# Patient Record
Sex: Female | Born: 1985 | Race: Black or African American | Hispanic: No | Marital: Single | Smoking: Never smoker
Health system: Southern US, Community
[De-identification: ages and names within clinical notes are randomized; demographics above are authoritative.]

## PROBLEM LIST (undated history)

## (undated) DIAGNOSIS — Z87440 Personal history of urinary (tract) infections: Secondary | ICD-10-CM

## (undated) HISTORY — PX: WISDOM TOOTH EXTRACTION: SHX21

## (undated) HISTORY — DX: Personal history of urinary (tract) infections: Z87.440

---
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
## 2009-06-28 ENCOUNTER — Ambulatory Visit: Payer: Self-pay | Admitting: Obstetrics and Gynecology

## 2009-11-29 ENCOUNTER — Inpatient Hospital Stay: Payer: Self-pay | Admitting: Obstetrics and Gynecology

## 2009-12-05 LAB — PATHOLOGY REPORT

## 2010-09-18 ENCOUNTER — Encounter: Payer: Self-pay | Admitting: Maternal & Fetal Medicine

## 2011-01-18 ENCOUNTER — Inpatient Hospital Stay: Payer: Self-pay

## 2011-01-21 LAB — PATHOLOGY REPORT

## 2012-09-29 DIAGNOSIS — A6000 Herpesviral infection of urogenital system, unspecified: Secondary | ICD-10-CM | POA: Insufficient documentation

## 2013-08-05 ENCOUNTER — Emergency Department: Payer: Self-pay | Admitting: Emergency Medicine

## 2014-10-30 LAB — HM HIV SCREENING LAB: HM HIV Screening: NEGATIVE

## 2015-05-11 ENCOUNTER — Emergency Department
Admission: EM | Admit: 2015-05-11 | Discharge: 2015-05-11 | Disposition: A | Discharge status: Home / Self Care | Payer: BLUE CROSS/BLUE SHIELD | Attending: Emergency Medicine | Admitting: Emergency Medicine

## 2015-05-11 ENCOUNTER — Encounter: Payer: Self-pay | Admitting: Emergency Medicine

## 2015-05-11 DIAGNOSIS — J029 Acute pharyngitis, unspecified: Secondary | ICD-10-CM | POA: Diagnosis present

## 2015-05-11 DIAGNOSIS — J0110 Acute frontal sinusitis, unspecified: Secondary | ICD-10-CM | POA: Diagnosis not present

## 2015-05-11 DIAGNOSIS — J321 Chronic frontal sinusitis: Secondary | ICD-10-CM

## 2015-05-11 LAB — POCT RAPID STREP A: Streptococcus, Group A Screen (Direct): NEGATIVE

## 2015-05-11 MED ORDER — AMOXICILLIN 500 MG PO TABS: 500.0000 mg | ORAL_TABLET | Freq: Three times a day (TID) | ORAL | Status: DC

## 2015-05-11 MED ORDER — AMOXICILLIN 500 MG PO CAPS
500.0000 mg | ORAL_CAPSULE | Freq: Once | ORAL | Status: AC
  Administered 2015-05-11: 500 mg via ORAL
  Filled 2015-05-11: qty 1

## 2015-05-11 NOTE — ED Provider Notes (Signed)
[facility] Emergency Department Provider Note  ____________________________________________  Time seen: Approximately 9:57 PM  I have reviewed the triage vital signs and the nursing notes.   HISTORY  Chief Complaint Sore Throat    HPI Paige Chandler is a 30 y.o. female with over one week history of URI symptoms. Was seen last week and treated for viral URI. Her symptoms have persisted with sinus pressure, fullness and now worsening sore throat and fever. She also has cough with congestion. No shortness of breath. Just some mild chest discomfort. Also some dyspepsia.   History reviewed. No pertinent past medical history.  There are no active problems to display for this patient.   History reviewed. No pertinent past surgical history.  Current Outpatient Rx  Name  Route  Sig  Dispense  Refill  . amoxicillin (AMOXIL) 500 MG tablet   Oral   Take 1 tablet (500 mg total) by mouth 3 (three) times daily.   30 tablet   0     Allergies Tramadol  History reviewed. No pertinent family history.  Social History Social History  Substance Use Topics  . Smoking status: Never Smoker   . Smokeless tobacco: None  . Alcohol Use: Yes     Comment: holidays    Review of Systems Constitutional: No fever/chills Eyes: No visual changes. ENT:  sore throat. Cardiovascular: chest pain with cough Respiratory: Denies shortness of breath. Gastrointestinal: No abdominal pain.  No nausea, no vomiting.  No diarrhea.  No constipation. Genitourinary: Negative for dysuria. Musculoskeletal: Negative for back pain. Skin: Negative for rash. Neurological: Negative for headaches, focal weakness or numbness. 10-point ROS otherwise negative.  ____________________________________________   PHYSICAL EXAM:  VITAL SIGNS: ED Triage Vitals  Enc Vitals Group     BP 05/11/15 1911 129/80 mmHg     Pulse Rate 05/11/15 1911 80     Resp 05/11/15 1911 18     Temp 05/11/15  1911 98.8 F (37.1 C)     Temp Source 05/11/15 1911 Oral     SpO2 05/11/15 1911 100 %     Weight 05/11/15 1911 150 lb (68.04 kg)     Height 05/11/15 1911  (1.626 m)     Head Cir --      Peak Flow --      Pain Score 05/11/15 1912 8     Pain Loc --      Pain Edu? --      Excl. in GC? --     Constitutional: Alert and oriented. Well appearing and in no acute distress. Eyes: Conjunctivae are normal. PERRL. EOMI. Ears:  Clear with normal landmarks. No erythema. Head: Atraumatic. Nose: No congestion/rhinnorhea. Mouth/Throat: Mucous membranes are moist.  Oropharynx non-erythematous. No lesions. Neck:  Supple.  No adenopathy.   Cardiovascular: Normal rate, regular rhythm. Grossly normal heart sounds.  Good peripheral circulation. Respiratory: Normal respiratory effort.  No retractions. Lungs CTAB. Gastrointestinal: Soft and nontender. No distention. No abdominal bruits. No CVA tenderness. Musculoskeletal: Nml ROM of upper and lower extremity joints. Neurologic:  Normal speech and language. No gross focal neurologic deficits are appreciated. No gait instability. Skin:  Skin is warm, dry and intact. No rash noted. Psychiatric: Mood and affect are normal. Speech and behavior are normal.  ____________________________________________   LABS (all labs ordered are listed, but only abnormal results are displayed)  Labs Reviewed  POCT RAPID STREP A   ____________________________________________  EKG   ____________________________________________  RADIOLOGY   ____________________________________________   PROCEDURES  Procedure(s) performed: None  Critical Care performed: No  ____________________________________________   INITIAL IMPRESSION / ASSESSMENT AND PLAN / ED COURSE  Pertinent labs & imaging results that were available during my care of the patient were reviewed by me and considered in my medical decision making (see chart for details).  30 year old female  with over one week history of URI symptoms, getting worse with sinus pressure and sore throat. Concern for sinusitis. Treated with amoxicillin. Will follow-up if not improving. ____________________________________________   FINAL CLINICAL IMPRESSION(S) / ED DIAGNOSES  Final diagnoses:  Frontal sinusitis, unspecified chronicity      Ignacia Bayley, PA-C 05/11/15 2200  Phineas Semen, MD 05/11/15 [phone]

## 2015-05-11 NOTE — ED Notes (Addendum)
Pt c/o sore throat since last night; unsure if any fever, has not checked; pain with swallowing; has taken tylenol, tried warm salt water gargles; no relief; able to swallow

## 2015-05-11 NOTE — Discharge Instructions (Signed)

## 2016-01-07 ENCOUNTER — Encounter: Payer: Self-pay | Admitting: Emergency Medicine

## 2016-01-07 ENCOUNTER — Emergency Department
Admission: EM | Admit: 2016-01-07 | Discharge: 2016-01-07 | Disposition: A | Discharge status: Home / Self Care | Payer: BLUE CROSS/BLUE SHIELD | Attending: Emergency Medicine | Admitting: Emergency Medicine

## 2016-01-07 DIAGNOSIS — K047 Periapical abscess without sinus: Secondary | ICD-10-CM | POA: Diagnosis not present

## 2016-01-07 DIAGNOSIS — K0889 Other specified disorders of teeth and supporting structures: Secondary | ICD-10-CM | POA: Diagnosis present

## 2016-01-07 MED ORDER — NAPROXEN 500 MG PO TABS: 500.0000 mg | ORAL_TABLET | Freq: Two times a day (BID) | ORAL | 0 refills | Status: DC

## 2016-01-07 MED ORDER — AMOXICILLIN 875 MG PO TABS: 875.0000 mg | ORAL_TABLET | Freq: Two times a day (BID) | ORAL | 0 refills | Status: DC

## 2016-01-07 NOTE — ED Triage Notes (Signed)
Pt with swelling to left lower jaw, reports dental pain.

## 2016-01-07 NOTE — ED Provider Notes (Signed)
[facility] Emergency Department Provider Note   ____________________________________________   None    (approximate)  I have reviewed the triage vital signs and the nursing notes.   HISTORY  Chief Complaint Dental Pain    HPI MAUREEN DELATTE is a 30 y.o. female presents with a one-day history of left lower jaw swelling and dental pain. Patient states started yesterday without worse today. Denies any fever chills nausea vomiting or difficulty swallowing. Describes her level of pain or discomfort is an 8/10 at this time.   History reviewed. No pertinent past medical history.  There are no active problems to display for this patient.   No past surgical history on file.  Prior to Admission medications   Medication Sig Start Date End Date Taking? Authorizing Provider  amoxicillin (AMOXIL) 875 MG tablet Take 1 tablet (875 mg total) by mouth 2 (two) times daily. 01/07/16   Evangeline Dakin, PA-C  naproxen (NAPROSYN) 500 MG tablet Take 1 tablet (500 mg total) by mouth 2 (two) times daily with a meal. 01/07/16   Evangeline Dakin, PA-C    Allergies Tramadol  No family history on file.  Social History Social History  Substance Use Topics  . Smoking status: Never Smoker  . Smokeless tobacco: Not on file  . Alcohol use Yes     Comment: holidays    Review of Systems Constitutional: No fever/chills Eyes: No visual changes. ENT: Positive for left-sided dental pain and facial swelling. Cardiovascular: Denies chest pain. Respiratory: Denies shortness of breath. Musculoskeletal: Negative for back pain. Skin: Negative for rash. Neurological: Negative for headaches, focal weakness or numbness.  10-point ROS otherwise negative.  ____________________________________________   PHYSICAL EXAM:  VITAL SIGNS: ED Triage Vitals [01/07/16 1225]  Enc Vitals Group     BP 118/63     Pulse Rate 63     Resp 18     Temp 98.4 F (36.9 C)     Temp Source  Oral     SpO2 100 %     Weight 152 lb (68.9 kg)     Height 5\' 4"  (1.626 m)     Head Circumference      Peak Flow      Pain Score 8     Pain Loc      Pain Edu?      Excl. in GC?     Constitutional: Alert and oriented. Well appearing and in no acute distress. Head: Atraumatic. Nose: No congestion/rhinnorhea. Mouth/Throat: Mucous membranes are moist.  Oropharynx non-erythematous. Facial swelling obvious on the left. Neck: No stridor.   Cardiovascular: Normal rate, regular rhythm. Grossly normal heart sounds.  Good peripheral circulation. Respiratory: Normal respiratory effort.  No retractions. Lungs CTAB. Musculoskeletal: No lower extremity tenderness nor edema.  No joint effusions. Neurologic:  Normal speech and language. No gross focal neurologic deficits are appreciated. No gait instability. Skin:  Skin is warm, dry and intact. No rash noted. Psychiatric: Mood and affect are normal. Speech and behavior are normal.  ____________________________________________   LABS (all labs ordered are listed, but only abnormal results are displayed)  Labs Reviewed - No data to display ____________________________________________  EKG   ____________________________________________  RADIOLOGY   ____________________________________________   PROCEDURES  Procedure(s) performed: None  Procedures  Critical Care performed: No  ____________________________________________   INITIAL IMPRESSION / ASSESSMENT AND PLAN / ED COURSE  Pertinent labs & imaging results that were available during my care of the patient were reviewed by me and  considered in my medical decision making (see chart for details).  Early onset dental abscess. Rx given for amoxicillin 875 mg twice a day and Naprosyn 500 mg twice a day. Work excuse 24 hours given. Patient follow-up with her local dentist or PCP. She voices no other emergency medical complaints at this time and is in agreement with  treatment.  Clinical Course     ____________________________________________   FINAL CLINICAL IMPRESSION(S) / ED DIAGNOSES  Final diagnoses:  Dental abscess      NEW MEDICATIONS STARTED DURING THIS VISIT:  New Prescriptions   AMOXICILLIN (AMOXIL) 875 MG TABLET    Take 1 tablet (875 mg total) by mouth 2 (two) times daily.   NAPROXEN (NAPROSYN) 500 MG TABLET    Take 1 tablet (500 mg total) by mouth 2 (two) times daily with a meal.     Note:  This document was prepared using Dragon voice recognition software and may include unintentional dictation errors.   Evangeline Dakinharles M Beers, PA-C 01/07/16 1351    Jene Everyobert Kinner, MD 01/07/16 [phone]

## 2016-01-07 NOTE — ED Notes (Signed)
Pt reports left facial swelling since this am - pt reports that the side of her face is numb and tight feeling - pt states left upper jaw tooth was hurting yesterday but pain was relieved by ASA - pt took IBU for swelling today but the swelling has not decreased

## 2016-05-08 ENCOUNTER — Emergency Department
Admission: EM | Admit: 2016-05-08 | Discharge: 2016-05-08 | Disposition: A | Discharge status: Home / Self Care | Payer: 59 | Attending: Emergency Medicine | Admitting: Emergency Medicine

## 2016-05-08 ENCOUNTER — Encounter: Payer: Self-pay | Admitting: Emergency Medicine

## 2016-05-08 DIAGNOSIS — R197 Diarrhea, unspecified: Secondary | ICD-10-CM | POA: Diagnosis not present

## 2016-05-08 DIAGNOSIS — Z791 Long term (current) use of non-steroidal anti-inflammatories (NSAID): Secondary | ICD-10-CM | POA: Insufficient documentation

## 2016-05-08 DIAGNOSIS — R1013 Epigastric pain: Secondary | ICD-10-CM

## 2016-05-08 DIAGNOSIS — R1011 Right upper quadrant pain: Secondary | ICD-10-CM | POA: Diagnosis present

## 2016-05-08 LAB — CBC
HCT: 36.1 % (ref 35.0–47.0)
HEMOGLOBIN: 12.5 g/dL (ref 12.0–16.0)
MCH: 31 pg (ref 26.0–34.0)
MCHC: 34.5 g/dL (ref 32.0–36.0)
MCV: 90 fL (ref 80.0–100.0)
Platelets: 265 10*3/uL (ref 150–440)
RBC: 4.02 MIL/uL (ref 3.80–5.20)
RDW: 12.5 % (ref 11.5–14.5)
WBC: 5.6 10*3/uL (ref 3.6–11.0)

## 2016-05-08 LAB — COMPREHENSIVE METABOLIC PANEL
ALBUMIN: 4 g/dL (ref 3.5–5.0)
ALK PHOS: 62 U/L (ref 38–126)
ALT: 9 U/L — AB (ref 14–54)
ANION GAP: 6 (ref 5–15)
AST: 18 U/L (ref 15–41)
BUN: 7 mg/dL (ref 6–20)
CALCIUM: 8.9 mg/dL (ref 8.9–10.3)
CHLORIDE: 105 mmol/L (ref 101–111)
CO2: 28 mmol/L (ref 22–32)
CREATININE: 0.75 mg/dL (ref 0.44–1.00)
GFR calc non Af Amer: >60 mL/min (ref >60)
GLUCOSE: 111 mg/dL — AB (ref 65–99)
Potassium: 3.6 mmol/L (ref 3.5–5.1)
SODIUM: 139 mmol/L (ref 135–145)
Total Bilirubin: 1.1 mg/dL (ref 0.3–1.2)
Total Protein: 8 g/dL (ref 6.5–8.1)

## 2016-05-08 LAB — URINALYSIS, COMPLETE (UACMP) WITH MICROSCOPIC
Bacteria, UA: NONE SEEN
Bilirubin Urine: NEGATIVE
Glucose, UA: NEGATIVE mg/dL
Hgb urine dipstick: NEGATIVE
KETONES UR: NEGATIVE mg/dL
Leukocytes, UA: NEGATIVE
Nitrite: NEGATIVE
PROTEIN: NEGATIVE mg/dL
Specific Gravity, Urine: 1.024 (ref 1.005–1.030)
pH: 5 (ref 5.0–8.0)

## 2016-05-08 LAB — LIPASE, BLOOD: Lipase: 27 U/L (ref 11–51)

## 2016-05-08 MED ORDER — FAMOTIDINE 20 MG PO TABS: 20.0000 mg | ORAL_TABLET | Freq: Two times a day (BID) | ORAL | 1 refills | Status: DC

## 2016-05-08 MED ORDER — FAMOTIDINE 20 MG PO TABS
20.0000 mg | ORAL_TABLET | Freq: Once | ORAL | Status: AC
  Administered 2016-05-08: 20 mg via ORAL
  Filled 2016-05-08: qty 1

## 2016-05-08 MED ORDER — DICYCLOMINE HCL 20 MG PO TABS: 20.0000 mg | ORAL_TABLET | Freq: Three times a day (TID) | ORAL | 0 refills | Status: DC | PRN

## 2016-05-08 MED ORDER — DICYCLOMINE HCL 20 MG PO TABS
20.0000 mg | ORAL_TABLET | Freq: Once | ORAL | Status: AC
  Administered 2016-05-08: 20 mg via ORAL
  Filled 2016-05-08: qty 1

## 2016-05-08 NOTE — ED Triage Notes (Signed)
C/O RUQ abdominal pain and back pain since Wednesday.  Also states had some diarrhea on Wednesday.  [facility], but was sent to ED for evaluation.

## 2016-05-08 NOTE — ED Provider Notes (Signed)
[facility] Emergency Department Provider Note        Time seen: ----------------------------------------- 12:16 PM on 05/08/2016 -----------------------------------------    I have reviewed the triage vital signs and the nursing notes.   HISTORY  Chief Complaint No chief complaint on file.    HPI Paige Chandler is a 31 y.o. female who presents to the ER for upper abdominal pain and back pain since Wednesday. Patient states she had diarrhea on Wednesday and her stomach has not felt right since then. Patient states she started having abdominal pain and diarrhea after she ate at work. She denies fevers, chills, vomiting or other complaints. Nothing really makes her symptoms better or worse. She does note her son had a vomiting and diarrheal illness this weekend   History reviewed. No pertinent past medical history.  There are no active problems to display for this patient.   History reviewed. No pertinent surgical history.  Allergies Tramadol  Social History Social History  Substance Use Topics  . Smoking status: Never Smoker  . Smokeless tobacco: Never Used  . Alcohol use Yes     Comment: holidays    Review of Systems Constitutional: Negative for fever. Cardiovascular: Negative for chest pain. Respiratory: Negative for shortness of breath. Gastrointestinal:Positive for abdominal pain, recent diarrhea Genitourinary: Negative for dysuria. Musculoskeletal: Positive for back pain Skin: Negative for rash. Neurological: Negative for headaches, focal weakness or numbness.  10-point ROS otherwise negative.  ____________________________________________   PHYSICAL EXAM:  VITAL SIGNS: ED Triage Vitals  Enc Vitals Group     BP 05/08/16 0916 119/85     Pulse Rate 05/08/16 0916 67     Resp 05/08/16 0916 16     Temp 05/08/16 0916 98.3 F (36.8 C)     Temp Source 05/08/16 0916 Oral     SpO2 05/08/16 0916 100 %     Weight 05/08/16 0917  145 lb (65.8 kg)     Height 05/08/16 0917 5\' 4"  (1.626 m)     Head Circumference --      Peak Flow --      Pain Score 05/08/16 0917 7     Pain Loc --      Pain Edu? --      Excl. in GC? --     Constitutional: Alert and oriented. Well appearing and in no distress. Eyes: Conjunctivae are normal. PERRL. Normal extraocular movements. ENT   Head: Normocephalic and atraumatic.   Nose: No congestion/rhinnorhea.   Mouth/Throat: Mucous membranes are moist.   Neck: No stridor. Cardiovascular: Normal rate, regular rhythm. No murmurs, rubs, or gallops. Respiratory: Normal respiratory effort without tachypnea nor retractions. Breath sounds are clear and equal bilaterally. No wheezes/rales/rhonchi. Gastrointestinal: Soft and nontender. Normal bowel sounds Musculoskeletal: Nontender with normal range of motion in all extremities. No lower extremity tenderness nor edema. Neurologic:  Normal speech and language. No gross focal neurologic deficits are appreciated.  Skin:  Skin is warm, dry and intact. No rash noted. Psychiatric: Mood and affect are normal. Speech and behavior are normal.  ____________________________________________  ED COURSE:  Pertinent labs & imaging results that were available during my care of the patient were reviewed by me and considered in my medical decision making (see chart for details). Patient is in no acute distress, we will assess with basic labs and consider imaging.   Procedures ____________________________________________   LABS (pertinent positives/negatives)  Labs Reviewed  COMPREHENSIVE METABOLIC PANEL - Abnormal; Notable for the following:  Result Value   Glucose, Bld 111 (*)    ALT 9 (*)    All other components within normal limits  URINALYSIS, COMPLETE (UACMP) WITH MICROSCOPIC - Abnormal; Notable for the following:    Color, Urine YELLOW (*)    APPearance CLEAR (*)    Squamous Epithelial / LPF 6-30 (*)    All other components  within normal limits  LIPASE, BLOOD  CBC  ____________________________________________  FINAL ASSESSMENT AND PLAN  Abdominal pain, diarrhea  Plan: Patient with labs as dictated above. Patient's symptoms are likely due to recent gastroenteritis. This may have exacerbated underlying GERD issues. She's being placed on Pepcid and Bentyl. She is stable for outpatient follow-up.   Emily Filbert, MD   Note: This note was generated in part or whole with voice recognition software. Voice recognition is usually quite accurate but there are transcription errors that can and very often do occur. I apologize for any typographical errors that were not detected and corrected.     Emily Filbert, MD 05/08/16 [phone]

## 2017-01-25 ENCOUNTER — Ambulatory Visit: Payer: Self-pay

## 2017-03-03 ENCOUNTER — Ambulatory Visit: Payer: Self-pay | Attending: Oncology

## 2017-03-19 ENCOUNTER — Emergency Department
Admission: EM | Admit: 2017-03-19 | Discharge: 2017-03-20 | Disposition: A | Discharge status: Home / Self Care | Payer: Self-pay | Attending: Emergency Medicine | Admitting: Emergency Medicine

## 2017-03-19 ENCOUNTER — Encounter: Payer: Self-pay | Admitting: Emergency Medicine

## 2017-03-19 ENCOUNTER — Emergency Department: Payer: Self-pay

## 2017-03-19 ENCOUNTER — Other Ambulatory Visit: Payer: Self-pay

## 2017-03-19 DIAGNOSIS — R102 Pelvic and perineal pain: Secondary | ICD-10-CM

## 2017-03-19 DIAGNOSIS — N83201 Unspecified ovarian cyst, right side: Secondary | ICD-10-CM | POA: Insufficient documentation

## 2017-03-19 DIAGNOSIS — R809 Proteinuria, unspecified: Secondary | ICD-10-CM | POA: Insufficient documentation

## 2017-03-19 DIAGNOSIS — Z79899 Other long term (current) drug therapy: Secondary | ICD-10-CM | POA: Insufficient documentation

## 2017-03-19 LAB — COMPREHENSIVE METABOLIC PANEL
ALK PHOS: 60 U/L (ref 38–126)
ALT: 9 U/L — AB (ref 14–54)
ANION GAP: 5 (ref 5–15)
AST: 18 U/L (ref 15–41)
Albumin: 3.7 g/dL (ref 3.5–5.0)
BILIRUBIN TOTAL: 0.8 mg/dL (ref 0.3–1.2)
BUN: 11 mg/dL (ref 6–20)
CALCIUM: 9.2 mg/dL (ref 8.9–10.3)
CO2: 28 mmol/L (ref 22–32)
CREATININE: 0.96 mg/dL (ref 0.44–1.00)
Chloride: 104 mmol/L (ref 101–111)
GFR calc non Af Amer: >60 mL/min (ref >60)
Glucose, Bld: 117 mg/dL — ABNORMAL HIGH (ref 65–99)
Potassium: 3.4 mmol/L — ABNORMAL LOW (ref 3.5–5.1)
SODIUM: 137 mmol/L (ref 135–145)
TOTAL PROTEIN: 8.1 g/dL (ref 6.5–8.1)

## 2017-03-19 LAB — CBC WITH DIFFERENTIAL/PLATELET
BASOS PCT: 1 %
Basophils Absolute: 0.1 10*3/uL (ref 0–0.1)
EOS ABS: 0.2 10*3/uL (ref 0–0.7)
EOS PCT: 2 %
HCT: 35.6 % (ref 35.0–47.0)
Hemoglobin: 12.2 g/dL (ref 12.0–16.0)
Lymphocytes Relative: 30 %
Lymphs Abs: 2.4 10*3/uL (ref 1.0–3.6)
MCH: 31.6 pg (ref 26.0–34.0)
MCHC: 34.2 g/dL (ref 32.0–36.0)
MCV: 92.3 fL (ref 80.0–100.0)
MONOS PCT: 10 %
Monocytes Absolute: 0.8 10*3/uL (ref 0.2–0.9)
NEUTROS PCT: 57 %
Neutro Abs: 4.7 10*3/uL (ref 1.4–6.5)
PLATELETS: 299 10*3/uL (ref 150–440)
RBC: 3.86 MIL/uL (ref 3.80–5.20)
RDW: 12.9 % (ref 11.5–14.5)
WBC: 8.1 10*3/uL (ref 3.6–11.0)

## 2017-03-19 LAB — URINALYSIS, ROUTINE W REFLEX MICROSCOPIC
Bacteria, UA: NONE SEEN
Bilirubin Urine: NEGATIVE
Glucose, UA: NEGATIVE mg/dL
HGB URINE DIPSTICK: NEGATIVE
Ketones, ur: NEGATIVE mg/dL
Leukocytes, UA: NEGATIVE
NITRITE: NEGATIVE
PH: 7 (ref 5.0–8.0)
Protein, ur: 30 mg/dL — AB
Specific Gravity, Urine: 1.027 (ref 1.005–1.030)

## 2017-03-19 LAB — HCG, QUANTITATIVE, PREGNANCY: hCG, Beta Chain, Quant, S: <1 m[IU]/mL (ref <5)

## 2017-03-19 LAB — POCT PREGNANCY, URINE: Preg Test, Ur: NEGATIVE

## 2017-03-19 LAB — LIPASE, BLOOD: Lipase: 27 U/L (ref 11–51)

## 2017-03-19 MED ORDER — IBUPROFEN 100 MG/5ML PO SUSP
600.0000 mg | Freq: Once | ORAL | Status: AC
  Administered 2017-03-19: 600 mg via ORAL
  Filled 2017-03-19: qty 30

## 2017-03-19 MED ORDER — IBUPROFEN 600 MG PO TABS
ORAL_TABLET | ORAL | Status: AC
  Filled 2017-03-19: qty 1

## 2017-03-19 MED ORDER — IBUPROFEN 100 MG/5ML PO SUSP
ORAL | Status: AC
  Administered 2017-03-19: 600 mg via ORAL
  Filled 2017-03-19: qty 30

## 2017-03-19 NOTE — ED Provider Notes (Signed)
[facility] Emergency Department Provider Note  ____________________________________________  Time seen: Approximately 11:06 PM  I have reviewed the triage vital signs and the nursing notes.   HISTORY  Chief Complaint Abdominal Pain   HPI Paige Chandler is a 31 y.o. female no significant past medical history who presents for evaluation of abdominal pain. Patient reports that the pain started earlier today, sharp, mild to moderate, intermittent and located in the suprapubic region. Currently pain is mild. No nausea, vomiting, chills, fever, dysuria, hematuria, vaginal discharge, vaginal bleeding, diarrhea, constipation. Last BM was yesterday. No prior abdominal surgeries. No prior history of STD.  History reviewed. No pertinent past medical history.  There are no active problems to display for this patient.   Past Surgical History:  Procedure Laterality Date  . WISDOM TOOTH EXTRACTION      Prior to Admission medications   Medication Sig Start Date End Date Taking? Authorizing Provider  amoxicillin (AMOXIL) 875 MG tablet Take 1 tablet (875 mg total) by mouth 2 (two) times daily. 01/07/16   Beers, Charmayne Sheer, PA-C  dicyclomine (BENTYL) 20 MG tablet Take 1 tablet (20 mg total) by mouth 3 (three) times daily as needed for spasms. 05/08/16   Emily Filbert, MD  famotidine (PEPCID) 20 MG tablet Take 1 tablet (20 mg total) by mouth 2 (two) times daily. 05/08/16   Emily Filbert, MD  naproxen (NAPROSYN) 500 MG tablet Take 1 tablet (500 mg total) by mouth 2 (two) times daily with a meal. 01/07/16   Beers, Charmayne Sheer, PA-C    Allergies Tramadol  No family history on file.  Social History Social History   Tobacco Use  . Smoking status: Never Smoker  . Smokeless tobacco: Never Used  Substance Use Topics  . Alcohol use: Yes    Comment: holidays  . Drug use: Not on file    Review of Systems  Constitutional: Negative for fever. Eyes:  Negative for visual changes. ENT: Negative for sore throat. Neck: No neck pain  Cardiovascular: Negative for chest pain. Respiratory: Negative for shortness of breath. Gastrointestinal: + lower abdominal pain. No vomiting or diarrhea. Genitourinary: Negative for dysuria. Musculoskeletal: Negative for back pain. Skin: Negative for rash. Neurological: Negative for headaches, weakness or numbness. Psych: No SI or HI  ____________________________________________   PHYSICAL EXAM:  VITAL SIGNS: ED Triage Vitals  Enc Vitals Group     BP 03/19/17 2138 130/89     Pulse Rate 03/19/17 2138 79     Resp 03/19/17 2138 20     Temp 03/19/17 2138 98.9 F (37.2 C)     Temp Source 03/19/17 2138 Oral     SpO2 03/19/17 2138 100 %     Weight 03/19/17 2138 160 lb (72.6 kg)     Height 03/19/17 2138 5\' 3"  (1.6 m)     Head Circumference --      Peak Flow --      Pain Score 03/19/17 2137 8     Pain Loc --      Pain Edu? --      Excl. in GC? --     Constitutional: Alert and oriented. Well appearing and in no apparent distress. HEENT:      Head: Normocephalic and atraumatic.         Eyes: Conjunctivae are normal. Sclera is non-icteric.       Mouth/Throat: Mucous membranes are moist.       Neck: Supple with no signs of meningismus. Cardiovascular:  Regular rate and rhythm. No murmurs, gallops, or rubs. 2+ symmetrical distal pulses are present in all extremities. No JVD. Respiratory: Normal respiratory effort. Lungs are clear to auscultation bilaterally. No wheezes, crackles, or rhonchi.  Gastrointestinal: Soft, mild ttp over the suprapubic region and right pelvic region. and non distended with positive bowel sounds. No rebound or guarding. Pelvic exam: Normal external genitalia, no rashes or lesions. Normal cervical mucus. Os closed. No cervical motion tenderness.  No uterine or adnexal tenderness.   Musculoskeletal: Nontender with normal range of motion in all extremities. No edema, cyanosis, or  erythema of extremities. Neurologic: Normal speech and language. Face is symmetric. Moving all extremities. No gross focal neurologic deficits are appreciated. Skin: Skin is warm, dry and intact. No rash noted. Psychiatric: Mood and affect are normal. Speech and behavior are normal.  ____________________________________________   LABS (all labs ordered are listed, but only abnormal results are displayed)  Labs Reviewed  WET PREP, GENITAL - Abnormal; Notable for the following components:      Result Value   Clue Cells Wet Prep HPF POC PRESENT (*)    WBC, Wet Prep HPF POC FEW (*)    All other components within normal limits  COMPREHENSIVE METABOLIC PANEL - Abnormal; Notable for the following components:   Potassium 3.4 (*)    Glucose, Bld 117 (*)    ALT 9 (*)    All other components within normal limits  URINALYSIS, ROUTINE W REFLEX MICROSCOPIC - Abnormal; Notable for the following components:   Color, Urine YELLOW (*)    APPearance CLEAR (*)    Protein, ur 30 (*)    Squamous Epithelial / LPF 0-5 (*)    All other components within normal limits  CHLAMYDIA/NGC RT PCR ([facility] ONLY)  LIPASE, BLOOD  CBC WITH DIFFERENTIAL/PLATELET  HCG, QUANTITATIVE, PREGNANCY  POC URINE PREG, ED  POCT PREGNANCY, URINE   ____________________________________________  EKG  none  ____________________________________________  RADIOLOGY  TVUS: 1. 2.3 cm right ovarian corpus luteal cyst with small volume free physiologic fluid within the pelvis. 2. Otherwise unremarkable and normal pelvic ultrasound. No evidence for torsion.  ____________________________________________   PROCEDURES  Procedure(s) performed: None Procedures Critical Care performed:  None ____________________________________________   INITIAL IMPRESSION / ASSESSMENT AND PLAN / ED COURSE  31 y.o. female no significant past medical history who presents for evaluation of sharp lower abdominal pain. Patient is  extremely well appearing, no distress, is normal vital signs, physical exam shows a soft abdomen with mild tenderness to palpation in the suprapubic and right pelvic regions with no rebound or guarding. No right lower quadrant or right upper quadrant tenderness on exam. Labs show a negative pregnancy test, UA with no evidence of UTI however does show a small amount of protein. CBC, CMP and lipase are all within normal limits. Differential diagnosis including STD versus ovarian pathology versus tubo-ovarian abscess versus ectopic pregnancy. Plan for pelvic exam,TV US, and motrin for pain    _________________________ 12:38 AM on 03/20/2017 -----------------------------------------  US showing a 2.3 cm R ovarian cyst which is most likely the etiology of her pain. Patient has no pain at this time. Pelvic exam normal. STD swabs pending. Discussed finding of proteinuria with patient and [facility] for follow up for this finding. Discussed signs and symptoms of torsion and recommended that she returns immediately to the ER if these develop.   As part of my medical decision making, I reviewed the following data within the electronic MEDICAL RECORD NUMBER  Nursing notes reviewed and incorporated, Labs reviewed , Radiograph reviewed , Notes from prior ED visits and [place] Controlled Substance Database    Pertinent labs & imaging results that were available during my care of the patient were reviewed by me and considered in my medical decision making (see chart for details).    ____________________________________________   FINAL CLINICAL IMPRESSION(S) / ED DIAGNOSES  Final diagnoses:  Pelvic pain  Right ovarian cyst  Proteinuria, unspecified type      NEW MEDICATIONS STARTED DURING THIS VISIT:  ED Discharge Orders    None       Note:  This document was prepared using Dragon voice recognition software and may include unintentional dictation errors.       Nita SickleVeronese, [place],  MD 03/20/17 [phone]

## 2017-03-19 NOTE — ED Triage Notes (Signed)
Pt reports lower abdominal pain since this afternoon reports pain is not better, pt denies any other bleeding, last BM 12/20/18pt talks in complete sentences.

## 2017-03-19 NOTE — ED Notes (Signed)
Patient transported to Ultrasound 

## 2017-03-20 LAB — WET PREP, GENITAL
Sperm: NONE SEEN
TRICH WET PREP: NONE SEEN
YEAST WET PREP: NONE SEEN

## 2017-03-20 LAB — CHLAMYDIA/NGC RT PCR (ARMC ONLY)
Chlamydia Tr: NOT DETECTED
N gonorrhoeae: NOT DETECTED

## 2017-03-20 NOTE — ED Notes (Signed)

## 2017-03-20 NOTE — Discharge Instructions (Signed)
Your ultrasound showed a cyst in your right ovary. Those usually resolve with no issues but if you develop severe pain in the right lower abdominal region you must return to the ER immediately to be evaluate for ovarian torsion which is a surgical emergency and may lead to you loosing your ovary. Take tylenol/ ibuprofen for your pain. Your urine also showed protein in it which is abnormal. There are a lot of things that can cause protein in the urine. Therefore it is very important that you follow up with [facility] within the next week for further evaluation.

## 2017-04-19 ENCOUNTER — Other Ambulatory Visit: Payer: Self-pay

## 2017-04-19 ENCOUNTER — Emergency Department
Admission: EM | Admit: 2017-04-19 | Discharge: 2017-04-19 | Disposition: A | Discharge status: Home / Self Care | Payer: Self-pay | Attending: Emergency Medicine | Admitting: Emergency Medicine

## 2017-04-19 ENCOUNTER — Emergency Department: Payer: Self-pay

## 2017-04-19 DIAGNOSIS — Z79899 Other long term (current) drug therapy: Secondary | ICD-10-CM | POA: Insufficient documentation

## 2017-04-19 DIAGNOSIS — R1011 Right upper quadrant pain: Secondary | ICD-10-CM

## 2017-04-19 DIAGNOSIS — M7918 Myalgia, other site: Secondary | ICD-10-CM | POA: Insufficient documentation

## 2017-04-19 LAB — BASIC METABOLIC PANEL
Anion gap: 7 (ref 5–15)
BUN: 8 mg/dL (ref 6–20)
CHLORIDE: 103 mmol/L (ref 101–111)
CO2: 26 mmol/L (ref 22–32)
CREATININE: 0.82 mg/dL (ref 0.44–1.00)
Calcium: 9 mg/dL (ref 8.9–10.3)
GFR calc Af Amer: >60 mL/min (ref >60)
GFR calc non Af Amer: >60 mL/min (ref >60)
GLUCOSE: 106 mg/dL — AB (ref 65–99)
POTASSIUM: 3.8 mmol/L (ref 3.5–5.1)
SODIUM: 136 mmol/L (ref 135–145)

## 2017-04-19 LAB — CBC
HEMATOCRIT: 38 % (ref 35.0–47.0)
Hemoglobin: 13 g/dL (ref 12.0–16.0)
MCH: 31.2 pg (ref 26.0–34.0)
MCHC: 34.1 g/dL (ref 32.0–36.0)
MCV: 91.4 fL (ref 80.0–100.0)
PLATELETS: 268 10*3/uL (ref 150–440)
RBC: 4.16 MIL/uL (ref 3.80–5.20)
RDW: 12.6 % (ref 11.5–14.5)
WBC: 5.7 10*3/uL (ref 3.6–11.0)

## 2017-04-19 LAB — URINALYSIS, COMPLETE (UACMP) WITH MICROSCOPIC
BILIRUBIN URINE: NEGATIVE
GLUCOSE, UA: NEGATIVE mg/dL
HGB URINE DIPSTICK: NEGATIVE
Ketones, ur: NEGATIVE mg/dL
LEUKOCYTES UA: NEGATIVE
NITRITE: NEGATIVE
PH: 6 (ref 5.0–8.0)
Protein, ur: NEGATIVE mg/dL
RBC / HPF: NONE SEEN RBC/hpf (ref 0–5)
SPECIFIC GRAVITY, URINE: 1.019 (ref 1.005–1.030)

## 2017-04-19 LAB — PREGNANCY, URINE: Preg Test, Ur: NEGATIVE

## 2017-04-19 LAB — FIBRIN DERIVATIVES D-DIMER (ARMC ONLY): FIBRIN DERIVATIVES D-DIMER (ARMC): 882.63 ng{FEU}/mL — AB (ref 0.00–499.00)

## 2017-04-19 LAB — HEPATIC FUNCTION PANEL
ALBUMIN: 3.7 g/dL (ref 3.5–5.0)
ALK PHOS: 60 U/L (ref 38–126)
ALT: 9 U/L — AB (ref 14–54)
AST: 19 U/L (ref 15–41)
BILIRUBIN INDIRECT: 0.8 mg/dL (ref 0.3–0.9)
Bilirubin, Direct: 0.1 mg/dL (ref 0.1–0.5)
TOTAL PROTEIN: 7.9 g/dL (ref 6.5–8.1)
Total Bilirubin: 0.9 mg/dL (ref 0.3–1.2)

## 2017-04-19 LAB — LIPASE, BLOOD: LIPASE: 29 U/L (ref 11–51)

## 2017-04-19 MED ORDER — IOPAMIDOL (ISOVUE-370) INJECTION 76%
75.0000 mL | Freq: Once | INTRAVENOUS | Status: AC | PRN
  Administered 2017-04-19: 75 mL via INTRAVENOUS
  Filled 2017-04-19: qty 75

## 2017-04-19 MED ORDER — CYCLOBENZAPRINE HCL 10 MG PO TABS
10.0000 mg | ORAL_TABLET | Freq: Once | ORAL | Status: AC
Start: 2017-04-19 — End: 2017-04-19
  Administered 2017-04-19: 10 mg via ORAL
  Filled 2017-04-19: qty 1

## 2017-04-19 MED ORDER — CYCLOBENZAPRINE HCL 10 MG PO TABS: 10.0000 mg | ORAL_TABLET | Freq: Three times a day (TID) | ORAL | 0 refills | Status: DC | PRN

## 2017-04-19 NOTE — ED Notes (Signed)
E signature pad not working. Unable to obtain

## 2017-04-19 NOTE — Discharge Instructions (Signed)
You are evaluated for right-sided pain, and as we discussed although no certain cause was found, your exam and evaluation are overall reassuring in the emergency room today.  I am most suspicious of muscle spasm or musculoskeletal cause of pain.  You may continue the ibuprofen as directed on labeling.  I am adding muscle relaxer.  May consider massage or physical therapy, stretching, as well as heat to that area to help loosen the muscles.  Return to the emergency room immediately for any worsening or uncontrolled pain, weakness, numbness, trouble breathing, chest pain, abdominal pain, fever, or any other symptoms concerning to you.

## 2017-04-19 NOTE — ED Triage Notes (Addendum)
Pt c/o burning right flank pain for the past week, denies injury. Denies any painful urination or discharge. States she has been trying icy hot and alcohol rub with no relief. Denies N/V/D

## 2017-04-19 NOTE — ED Provider Notes (Signed)
[facility] Emergency Department Provider Note ____________________________________________   I have reviewed the triage vital signs and the triage nursing note.  HISTORY  Chief Complaint Flank Pain   Historian Patient  HPI Paige Chandler is a 32 y.o. female presenting for evaluation of pain at the right posterior thoracic back medial to the scapula and also around on the side of the ribs at the latissimus dorsi area, she states sometimes extending around to the front of the abdomen although she is not really complaining of abdominal pain per se.  Symptoms have been there about 1 week.  It is worse when she moves around.  She does not know of any known trauma or overuse injury.  No dysuria or hematuria.  She was diagnosed with a ovarian cyst in December, but states that that is much improved from previous, and does not seem to be related to the discomfort she is been having now.  Over the weekend she did have some central epigastric discomfort that felt a little bit like burning, that is really gone now.   History reviewed. No pertinent past medical history.  There are no active problems to display for this patient.   Past Surgical History:  Procedure Laterality Date  . WISDOM TOOTH EXTRACTION      Prior to Admission medications   Medication Sig Start Date End Date Taking? Authorizing Provider  amoxicillin (AMOXIL) 875 MG tablet Take 1 tablet (875 mg total) by mouth 2 (two) times daily. 01/07/16   Beers, Charmayne Sheer, PA-C  cyclobenzaprine (FLEXERIL) 10 MG tablet Take 1 tablet (10 mg total) by mouth 3 (three) times daily as needed for muscle spasms. 04/19/17   Governor Rooks, MD  dicyclomine (BENTYL) 20 MG tablet Take 1 tablet (20 mg total) by mouth 3 (three) times daily as needed for spasms. 05/08/16   Emily Filbert, MD  famotidine (PEPCID) 20 MG tablet Take 1 tablet (20 mg total) by mouth 2 (two) times daily. 05/08/16   Emily Filbert, MD   naproxen (NAPROSYN) 500 MG tablet Take 1 tablet (500 mg total) by mouth 2 (two) times daily with a meal. 01/07/16   Beers, Charmayne Sheer, PA-C    Allergies  Allergen Reactions  . Tramadol Nausea And Vomiting    No family history on file.  Social History Social History   Tobacco Use  . Smoking status: Never Smoker  . Smokeless tobacco: Never Used  Substance Use Topics  . Alcohol use: Yes    Comment: holidays  . Drug use: Not on file    Review of Systems  Constitutional: Negative for fever. Eyes: Negative for visual changes. ENT: Negative for sore throat. Cardiovascular: The right sided rib/back pain sometimes occasionally comes around to the front on the right side. Respiratory: Negative for shortness of breath or pleuritic chest pain. Gastrointestinal: Negative for  vomiting and diarrhea. Genitourinary: Negative for dysuria. Musculoskeletal: Right-sided thoracic pain as per HPI. Skin: Negative for rash. Neurological: Negative for headache.  ____________________________________________   PHYSICAL EXAM:  VITAL SIGNS: ED Triage Vitals [04/19/17 1121]  Enc Vitals Group     BP 115/70     Pulse Rate 78     Resp 17     Temp 97.6 F (36.4 C)     Temp Source Oral     SpO2 100 %     Weight 160 lb (72.6 kg)     Height 5\' 3"  (1.6 m)     Head Circumference  Peak Flow      Pain Score 8     Pain Loc      Pain Edu?      Excl. in GC?      Constitutional: Alert and oriented. Well appearing and in no distress. HEENT   Head: Normocephalic and atraumatic.      Eyes: Conjunctivae are normal. Pupils equal and round.       Ears:         Nose: No congestion/rhinnorhea.   Mouth/Throat: Mucous membranes are moist.   Neck: No stridor. Cardiovascular/Chest: Normal rate, regular rhythm.  No murmurs, rubs, or gallops. Respiratory: Normal respiratory effort without tachypnea nor retractions. Breath sounds are clear and equal bilaterally. No  wheezes/rales/rhonchi. Gastrointestinal: Soft. No distention, no guarding, no rebound. Nontender.    Genitourinary/rectal:Deferred Musculoskeletal: She has tender palpable muscle spasm of the trapezius and down into the medial aspect of the scapula on the right side as well as tender muscles and soft tissue along the latissimus dorsi area of the right lateral chest wall underneath the arm.  Nontender with normal range of motion in all extremities. No joint effusions.  No lower extremity tenderness.  No edema. Neurologic:  Normal speech and language. No gross or focal neurologic deficits are appreciated. Skin:  Skin is warm, dry and intact. No rash noted. Psychiatric: Mood and affect are normal. Speech and behavior are normal. Patient exhibits appropriate insight and judgment.   ____________________________________________  LABS (pertinent positives/negatives) I, Governor Rooks, MD the attending physician have reviewed the labs noted below.  Labs Reviewed  URINALYSIS, COMPLETE (UACMP) WITH MICROSCOPIC - Abnormal; Notable for the following components:      Result Value   Color, Urine YELLOW (*)    APPearance HAZY (*)    Bacteria, UA RARE (*)    Squamous Epithelial / LPF 6-30 (*)    All other components within normal limits  BASIC METABOLIC PANEL - Abnormal; Notable for the following components:   Glucose, Bld 106 (*)    All other components within normal limits  HEPATIC FUNCTION PANEL - Abnormal; Notable for the following components:   ALT 9 (*)    All other components within normal limits  FIBRIN DERIVATIVES D-DIMER ([facility] ONLY) - Abnormal; Notable for the following components:   Fibrin derivatives D-dimer ([facility]) 882.63 (*)    All other components within normal limits  URINE CULTURE  CBC  LIPASE, BLOOD  PREGNANCY, URINE    ____________________________________________    EKG I, Governor Rooks, MD, the attending physician have personally viewed and interpreted all ECGs.  61 bpm.   Normal sinus rhythm.  Narrow QRS.  Normal axis.  Normal ST and T wave. ____________________________________________  RADIOLOGY All Xrays were viewed by me.  Imaging interpreted by Radiologist, and I, Governor Rooks, MD the attending physician have reviewed the radiologist interpretation noted below.  Chest x-ray two-view:  IMPRESSION: No acute cardiopulmonary disease.  Right upper quadrant ultrasound:   IMPRESSION: No acute abnormality noted.  CT chest for PE:  IMPRESSION: 1. No active pulmonary disease. 2. No acute pulmonary embolus. __________________________________________  PROCEDURES  Procedure(s) performed: None  Critical Care performed: None   ____________________________________________  ED COURSE / ASSESSMENT AND PLAN  Pertinent labs & imaging results that were available during my care of the patient were reviewed by me and considered in my medical decision making (see chart for details).   On clinical exam am most suspicious of musculoskeletal pain with muscle spasm, however am going to add on  chest x-ray, LFTs as well as right upper quadrant ultrasound to finish thorough investigation.  Am going to try muscle relaxer to start.  Lfts normal, lipase normal.  CXR normal.  RUQ us normal.  DDimer elevated, discussed risk and benefit of ct for further evaluation and chose to proceed.   CT scan thankfully negative for PE or other acute finding.  I am most suspicious of musculoskeletal source of pain.  We discussed conservative management with continued ibuprofen, I am adding Flexeril for muscle relaxer, as well as rest, heat, massage and/or physical therapy and follow-up with primary care doctor.  DIFFERENTIAL DIAGNOSIS: Differential diagnosis includes, but is not limited to, ACS, aortic dissection, pulmonary embolism, cardiac tamponade, pneumothorax, pneumonia, pericarditis, myocarditis, GI-related causes including esophagitis/gastritis, and musculoskeletal chest  wall pain.    Differential diagnosis includes, but is not limited to, biliary disease (biliary colic, acute cholecystitis, cholangitis, choledocholithiasis, etc), intrathoracic causes for epigastric abdominal pain including ACS, gastritis, duodenitis, pancreatitis, small bowel or large bowel obstruction, abdominal aortic aneurysm, hernia, and gastritis.  CONSULTATIONS: None  Patient / Family / Caregiver informed of clinical course, medical decision-making process, and agree with plan.   I discussed return precautions, follow-up instructions, and discharge instructions with patient and/or family.  Discharge Instructions : You are evaluated for right-sided pain, and as we discussed although no certain cause was found, your exam and evaluation are overall reassuring in the emergency room today.  I am most suspicious of muscle spasm or musculoskeletal cause of pain.  You may continue the ibuprofen as directed on labeling.  I am adding muscle relaxer.  May consider massage or physical therapy, stretching, as well as heat to that area to help loosen the muscles.  Return to the emergency room immediately for any worsening or uncontrolled pain, weakness, numbness, trouble breathing, chest pain, abdominal pain, fever, or any other symptoms concerning to you.     ___________________________________________   FINAL CLINICAL IMPRESSION(S) / ED DIAGNOSES   Final diagnoses:  RUQ pain  Musculoskeletal pain      ___________________________________________        Note: This dictation was prepared with Dragon dictation. Any transcriptional errors that result from this process are unintentional    Governor RooksLord, Tabias Swayze, MD 04/19/17 1410

## 2017-04-21 ENCOUNTER — Ambulatory Visit: Payer: Self-pay | Attending: Oncology

## 2017-04-21 LAB — URINE CULTURE

## 2017-11-17 LAB — HM PAP SMEAR

## 2019-01-29 ENCOUNTER — Other Ambulatory Visit: Payer: Self-pay

## 2019-01-29 DIAGNOSIS — Z5321 Procedure and treatment not carried out due to patient leaving prior to being seen by health care provider: Secondary | ICD-10-CM | POA: Insufficient documentation

## 2019-01-29 LAB — COMPREHENSIVE METABOLIC PANEL
ALT: 11 U/L (ref 0–44)
AST: 19 U/L (ref 15–41)
Albumin: 3.7 g/dL (ref 3.5–5.0)
Alkaline Phosphatase: 61 U/L (ref 38–126)
Anion gap: 8 (ref 5–15)
BUN: 15 mg/dL (ref 6–20)
CO2: 28 mmol/L (ref 22–32)
Calcium: 9.1 mg/dL (ref 8.9–10.3)
Chloride: 103 mmol/L (ref 98–111)
Creatinine, Ser: 0.95 mg/dL (ref 0.44–1.00)
GFR calc Af Amer: >60 mL/min (ref >60)
GFR calc non Af Amer: >60 mL/min (ref >60)
Glucose, Bld: 100 mg/dL — ABNORMAL HIGH (ref 70–99)
Potassium: 3.5 mmol/L (ref 3.5–5.1)
Sodium: 139 mmol/L (ref 135–145)
Total Bilirubin: 0.8 mg/dL (ref 0.3–1.2)
Total Protein: 7.9 g/dL (ref 6.5–8.1)

## 2019-01-29 LAB — URINALYSIS, COMPLETE (UACMP) WITH MICROSCOPIC
Bilirubin Urine: NEGATIVE
Glucose, UA: NEGATIVE mg/dL
Hgb urine dipstick: NEGATIVE
Ketones, ur: NEGATIVE mg/dL
Leukocytes,Ua: NEGATIVE
Nitrite: NEGATIVE
Protein, ur: NEGATIVE mg/dL
Specific Gravity, Urine: 1.035 — ABNORMAL HIGH (ref 1.005–1.030)
pH: 5 (ref 5.0–8.0)

## 2019-01-29 LAB — POCT PREGNANCY, URINE: Preg Test, Ur: NEGATIVE

## 2019-01-29 LAB — CBC
HCT: 36.9 % (ref 36.0–46.0)
Hemoglobin: 12.1 g/dL (ref 12.0–15.0)
MCH: 30.3 pg (ref 26.0–34.0)
MCHC: 32.8 g/dL (ref 30.0–36.0)
MCV: 92.5 fL (ref 80.0–100.0)
Platelets: 339 10*3/uL (ref 150–400)
RBC: 3.99 MIL/uL (ref 3.87–5.11)
RDW: 11.7 % (ref 11.5–15.5)
WBC: 7.8 10*3/uL (ref 4.0–10.5)
nRBC: 0 % (ref 0.0–0.2)

## 2019-01-29 LAB — LIPASE, BLOOD: Lipase: 31 U/L (ref 11–51)

## 2019-01-29 NOTE — ED Triage Notes (Signed)
Pt c/o RLQ abdominal pain x 2 days, denies any N/V/D.  Pt states she has been taking tylenol for the pain.  Pt denies any dysuria.

## 2019-01-30 ENCOUNTER — Emergency Department
Admission: EM | Admit: 2019-01-30 | Discharge: 2019-01-30 | Disposition: A | Discharge status: Home / Self Care | Payer: Self-pay | Attending: Emergency Medicine | Admitting: Emergency Medicine

## 2019-01-30 NOTE — ED Notes (Signed)
Pt to front desk inquiring about wait time- pt advised that there are 4 people in front of her time wise- pt states that she is going to go home.

## 2019-03-10 IMAGING — CR DG CHEST 2V
1 series · 2 of 2 positions shown · non-contrast
Comparison: none

CLINICAL DATA: Pt c/o burning right flank pain for the past week,
denies injury. Denies any painful urination or discharge. States she
has been trying icy hot and alcohol Olu with no relief. Denies N/V/D
Denies PMH, never a smoker

EXAM:
CHEST - 2 VIEW

[Series 1: dg chest 2 view · 0.14mm/px · 2 of 2 slices shown]
[im 1/2]
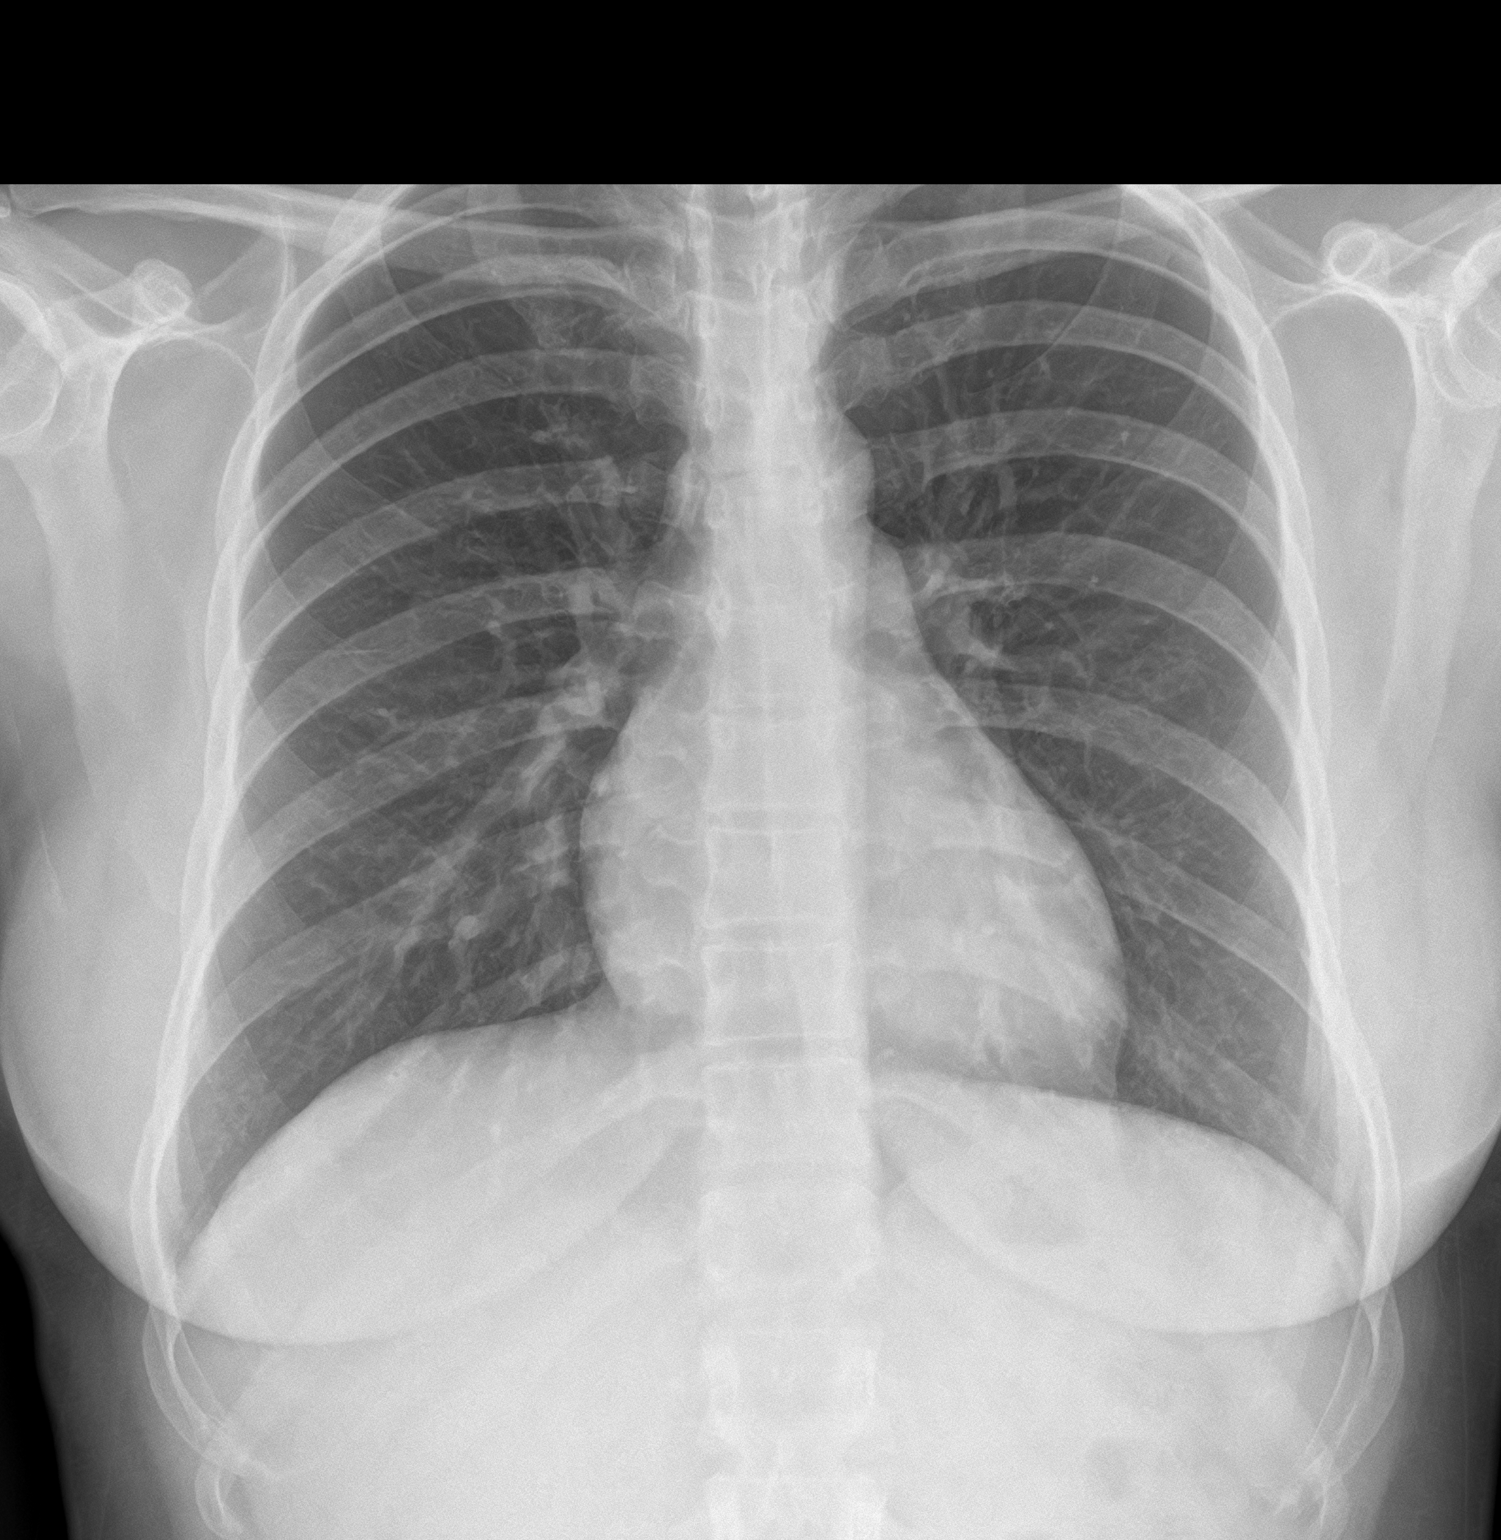
[im 2/2]
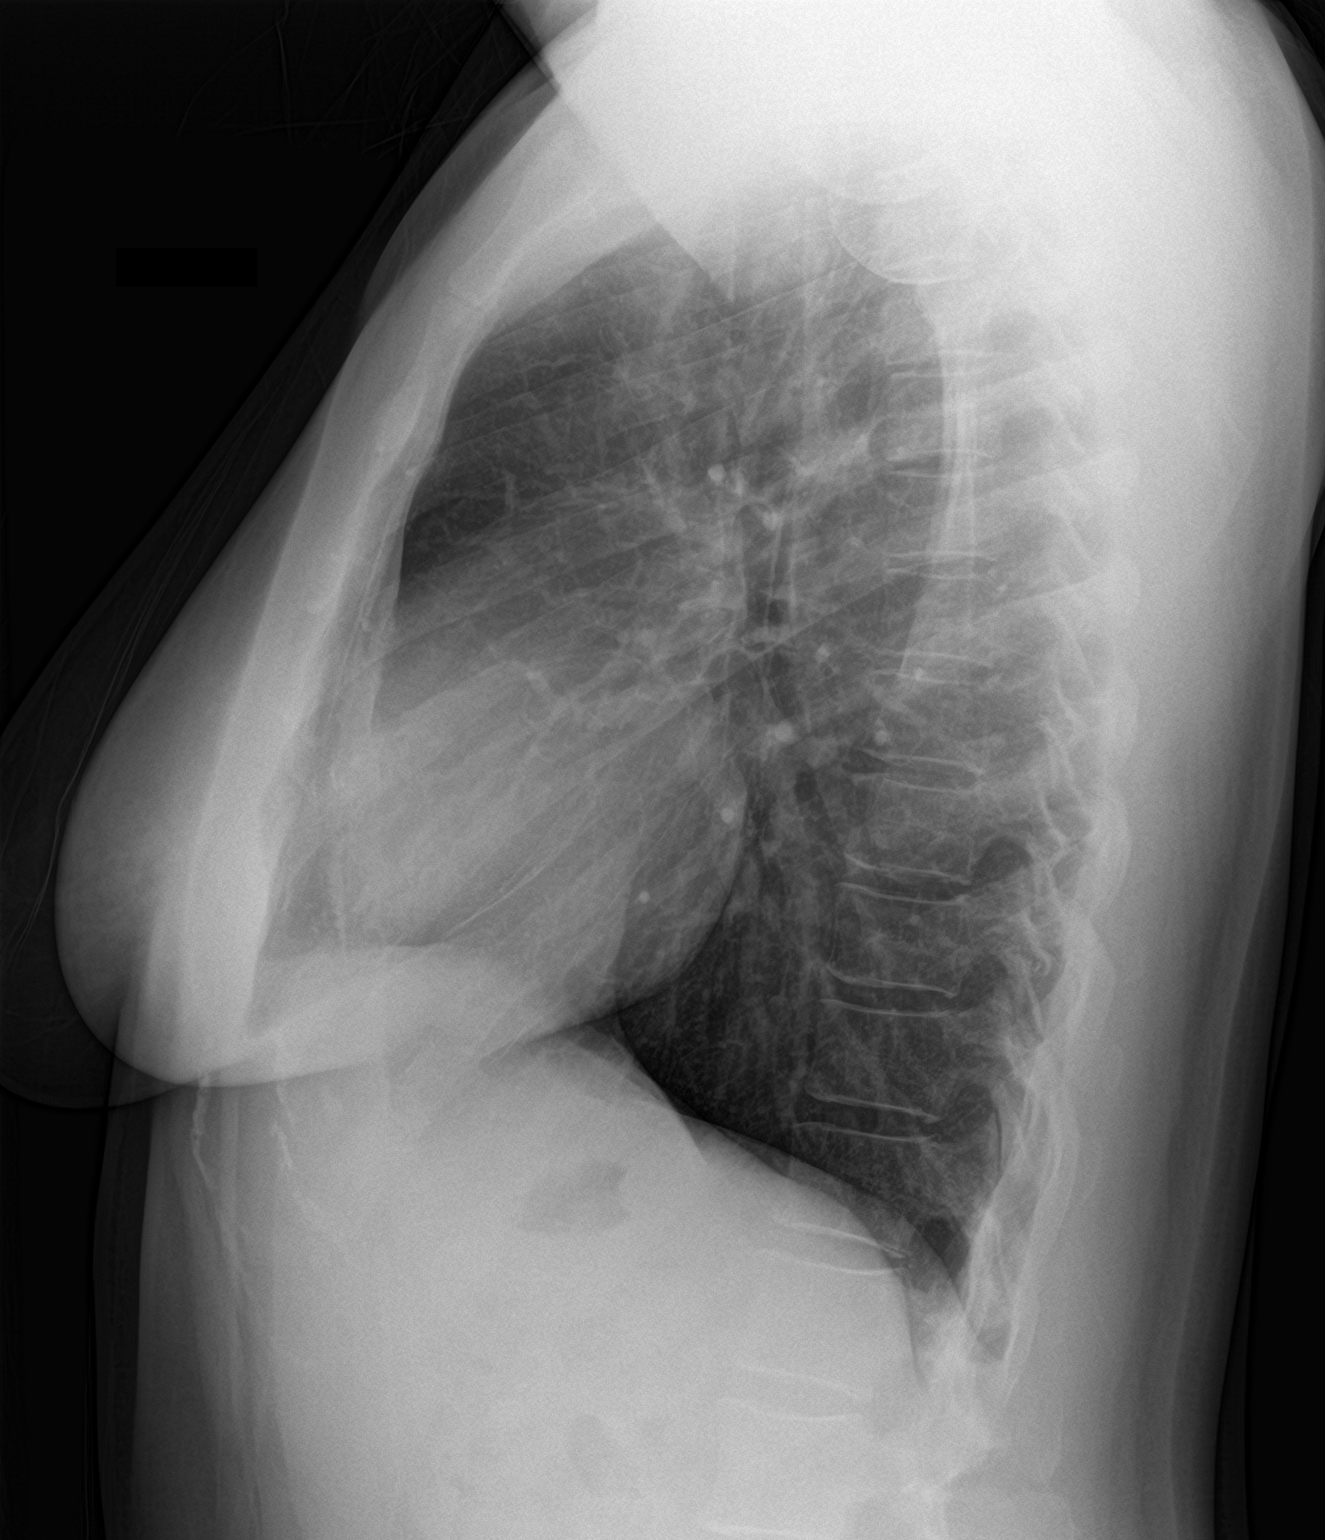

[2 of 2 positions shown; findings below may reference images not displayed]

FINDINGS: Lungs are clear.

Heart size and mediastinal contours are within normal limits.

No effusion.

Visualized bones unremarkable.
IMPRESSION: No acute cardiopulmonary disease.

## 2019-03-31 DIAGNOSIS — Z8742 Personal history of other diseases of the female genital tract: Secondary | ICD-10-CM

## 2019-03-31 HISTORY — DX: Personal history of other diseases of the female genital tract: Z87.42

## 2019-08-16 ENCOUNTER — Encounter: Payer: Self-pay | Admitting: Advanced Practice Midwife

## 2019-08-16 ENCOUNTER — Ambulatory Visit (LOCAL_COMMUNITY_HEALTH_CENTER): Payer: BC Managed Care – PPO | Admitting: Advanced Practice Midwife

## 2019-08-16 ENCOUNTER — Other Ambulatory Visit: Payer: Self-pay

## 2019-08-16 DIAGNOSIS — R87613 High grade squamous intraepithelial lesion on cytologic smear of cervix (HGSIL): Secondary | ICD-10-CM | POA: Insufficient documentation

## 2019-08-16 DIAGNOSIS — Z30013 Encounter for initial prescription of injectable contraceptive: Secondary | ICD-10-CM

## 2019-08-16 DIAGNOSIS — Z3009 Encounter for other general counseling and advice on contraception: Secondary | ICD-10-CM

## 2019-08-16 DIAGNOSIS — R87610 Atypical squamous cells of undetermined significance on cytologic smear of cervix (ASC-US): Secondary | ICD-10-CM

## 2019-08-16 MED ORDER — MULTIVITAMINS PO CAPS: 1.0000 | ORAL_CAPSULE | Freq: Every day | ORAL | 0 refills | Status: DC

## 2019-08-16 MED ORDER — MEDROXYPROGESTERONE ACETATE 150 MG/ML IM SUSP
150.0000 mg | INTRAMUSCULAR | Status: AC
  Administered 2019-08-16: 150 mg via INTRAMUSCULAR

## 2019-08-16 MED ORDER — MULTIVITAMINS PO CAPS: 1.0000 | ORAL_CAPSULE | Freq: Every day | ORAL | 0 refills | Status: AC

## 2019-08-16 NOTE — Progress Notes (Signed)
   WH problem visit  [facility]  Subjective:  Paige Chandler is a 34 y.o.SBF G3P3 nonsmoker being seen today for DMPA reinitiation.  Chief Complaint  Patient presents with  . Contraception    HPI  Last DMPA 90 wks ago.  LMP 07/29/19.  Last sex 05/2019 with condom.  Last PE at [facility] 05/10/19.  Last Pap 11/17/17 [facility].  Colpo at [facility] 06/20/19 with f/u there 12/21/19.  Denies cigs, MJ.  Last ETOH last night (1 glass wine) 2x/mo Does the patient have a current or past history of drug use? No   No components found for: HCV]   Health Maintenance Due  Topic Date Due  . COVID-19 Vaccine (1) Never done  . TETANUS/TDAP  Never done  . PAP SMEAR-Modifier  11/18/2018    ROS  The following portions of the patient's history were reviewed and updated as appropriate: allergies, current medications, past family history, past medical history, past social history, past surgical history and problem list. Problem list updated.   See flowsheet for other program required questions.  Objective:  There were no vitals filed for this visit.  Physical Exam  n/a  Assessment and Plan:  Paige Chandler is a 34 y.o. female presenting to the [facility] Department for a Women's Health problem visit  1. Family planning   2. Encounter for initial prescription of injectable contraceptive DMPA 150 mg IM q 11-13 wks x 1 year Please counsel on need for abstinance/back up condoms next 7 days     Return for 11-13 wk DMPA.  No future appointments.  Alberteen Spindle, CNM

## 2019-08-16 NOTE — Progress Notes (Signed)
Depo given and tolerated well. MVI's accepted. Victoriah Wilds, RN  

## 2019-08-16 NOTE — Progress Notes (Signed)
Here today to restart Depo. Last PE, Pap Smear and Depo here was 11/17/2017. Had well woman visit at [facility] 05/10/2019. Had a Colposcopy 06/20/2019 at [facility] and has a follow up 12/21/2019 for repeat procedure. Declines all STD screening today. Tawny Hopping, RN

## 2020-01-26 ENCOUNTER — Ambulatory Visit (LOCAL_COMMUNITY_HEALTH_CENTER): Payer: BC Managed Care – PPO | Admitting: Family Medicine

## 2020-01-26 ENCOUNTER — Other Ambulatory Visit: Payer: Self-pay

## 2020-01-26 VITALS — BP 129/85 | HR 63 | Ht 64.0 in | Wt 179.6 lb

## 2020-01-26 DIAGNOSIS — Z3009 Encounter for other general counseling and advice on contraception: Secondary | ICD-10-CM

## 2020-01-26 DIAGNOSIS — Z30013 Encounter for initial prescription of injectable contraceptive: Secondary | ICD-10-CM

## 2020-01-26 DIAGNOSIS — Z3042 Encounter for surveillance of injectable contraceptive: Secondary | ICD-10-CM

## 2020-01-26 MED ORDER — MEDROXYPROGESTERONE ACETATE 150 MG/ML IM SUSP
150.0000 mg | Freq: Once | INTRAMUSCULAR | Status: AC
  Administered 2020-01-26: 150 mg via INTRAMUSCULAR

## 2020-01-26 NOTE — Progress Notes (Signed)
Presents for Depo. Depo overdue, last depo 07/2019. Depo given today, tolerated well. Next Depo card given. Sharlyne Pacas, RN

## 2020-01-26 NOTE — Progress Notes (Signed)
Family Planning Visit  Subjective:  Paige Chandler is a 34 y.o. being seen today for  Chief Complaint  Patient presents with  . 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  HPI  Patient reports she is here for missed Depo injection. Last injection was 08/16/19, so today she's 20+ wks since last injection. Sites scheduling difficulties to get here for injections. She has had spotting recently, no normal period yet though.  Her last PE was 5/21.   Pt denies all of the following, which are contraindications to Depo use: Known breast cancer Pregnancy Also denies: Hypertension (CDC cat 2 if mild, cat 3 if severe) Severe cirrhosis, hepatocellular adenoma Diabetes with nephrosis or vascular complications Ischemic heart disease or multiple risk factors for atherosclerotic disease, and some forms of lupus Unexplained vaginal bleeding Pregnancy planned within the next year Long-term use of corticosteroid therapy in women with a history of, or risk factors for, nontraumatic (frailty) fractures.  Current use of aminoglutethimide (usually for the treatment of Cushing's syndrome) because aminoglutethimide may increase metabolism of progestins   No LMP recorded (lmp unknown). Last sex: 3/21 Pt desires EC? n/a  Last pap: Last Pap 11/17/17 [facility].  Colpo at [facility] 06/20/19, hasn't had f/u though states she will schedule this.  Last breast exam: 5/21  Patient reports 1 partner(s) in last year. Do they desire STI screening (if no, why not)? no  Does the patient desire a pregnancy in the next year? no   34 y.o., Body mass index is 30.83 kg/m. - Is patient eligible for HA1C diabetes screening based on BMI and age >76?  no  Has patient been screened once for HCV in the past?  no  No results found for: HCVAB  Does the patient have current of drug use, have a partner with drug use, and/or has been incarcerated since last  result? no If yes-- Screen for HCV through [facility]   Does the patient meet criteria for HBV testing? no  Criteria:  -Household, sexual or needle sharing contact with HBV -History of drug use -HIV positive -Those with known Hep C  See flowsheet for other program required questions.   Health Maintenance Due  Topic Date Due  . Hepatitis C Screening  Never done  . COVID-19 Vaccine (1) Never done  . TETANUS/TDAP  Never done  . PAP SMEAR-Modifier  11/18/2018  . INFLUENZA VACCINE  Never done    ROS  none  The following portions of the patient's history were reviewed and updated as appropriate: allergies, current medications, past family history, past medical history, past social history, past surgical history and problem list. Problem list updated.  Objective:   Vitals:   01/26/20 1112  BP: 129/85  Pulse: 63  Weight: 179 lb 9.6 oz (81.5 kg)  Height: 5\' 4"  (1.626 m)     Physical Exam  Gen: well appearing, NAD HEENT: no scleral icterus Lung: Normal WOB Ext: well perfused, no edema    Assessment and Plan:  Paige Chandler is a 34 y.o. female presenting to the [facility] Department for a well woman exam/family planning visit  Contraception counseling: Reviewed all forms of birth control options in the tiered based approach. available including abstinence; over the counter/barrier methods; hormonal contraceptive medication including pill, patch, ring, injection,contraceptive implant, ECP; hormonal and nonhormonal IUDs; permanent sterilization options including vasectomy and the various tubal  sterilization modalities. Risks, benefits, and typical effectiveness rates were reviewed.  Questions were answered.  Written information was also given to the patient to review.  Patient desires depo, this was prescribed for patient. She will follow up in  3 months for surveillance.  She was told to call with any further questions, or with any concerns about this method of  contraception.  Emphasized use of condoms 100% of the time for STI prevention.  Emergency Contraception: n/a  1. Encounter for surveillance of injectable contraceptive -Rx depo x1 today, then continue from 1 year order placed 5/21 by E. Sciora. Discussed alternative BCM as she has difficulty coming to appts, but she prefers to stick with depo. -Pregnancy risk assessed, no sex since 3/21, therefore no pregnancy test needed today. - medroxyPROGESTERone (DEPO-PROVERA) injection 150 mg    Return in about 3 months (around 04/27/2020) for Depo.  No future appointments.  Ann Held, PA-C

## 2020-05-28 ENCOUNTER — Ambulatory Visit (LOCAL_COMMUNITY_HEALTH_CENTER): Payer: BC Managed Care – PPO | Admitting: Family Medicine

## 2020-05-28 ENCOUNTER — Other Ambulatory Visit: Payer: Self-pay

## 2020-05-28 ENCOUNTER — Encounter: Payer: Self-pay | Admitting: Family Medicine

## 2020-05-28 VITALS — BP 104/71 | HR 69 | Ht 64.0 in | Wt 186.2 lb

## 2020-05-28 DIAGNOSIS — Z01419 Encounter for gynecological examination (general) (routine) without abnormal findings: Secondary | ICD-10-CM

## 2020-05-28 DIAGNOSIS — Z3042 Encounter for surveillance of injectable contraceptive: Secondary | ICD-10-CM | POA: Diagnosis not present

## 2020-05-28 DIAGNOSIS — B9689 Other specified bacterial agents as the cause of diseases classified elsewhere: Secondary | ICD-10-CM

## 2020-05-28 DIAGNOSIS — Z1272 Encounter for screening for malignant neoplasm of vagina: Secondary | ICD-10-CM

## 2020-05-28 DIAGNOSIS — Z3009 Encounter for other general counseling and advice on contraception: Secondary | ICD-10-CM

## 2020-05-28 DIAGNOSIS — Z113 Encounter for screening for infections with a predominantly sexual mode of transmission: Secondary | ICD-10-CM

## 2020-05-28 LAB — WET PREP FOR TRICH, YEAST, CLUE
Trichomonas Exam: NEGATIVE
Yeast Exam: NEGATIVE

## 2020-05-28 LAB — PREGNANCY, URINE: Preg Test, Ur: NEGATIVE

## 2020-05-28 MED ORDER — METRONIDAZOLE 500 MG PO TABS: 500.0000 mg | ORAL_TABLET | Freq: Two times a day (BID) | ORAL | 0 refills | Status: AC

## 2020-05-28 MED ORDER — MEDROXYPROGESTERONE ACETATE 150 MG/ML IM SUSP
150.0000 mg | INTRAMUSCULAR | Status: AC
  Administered 2020-05-28 – 2020-10-30 (×2): 150 mg via INTRAMUSCULAR

## 2020-05-28 NOTE — Progress Notes (Signed)
Family Planning Visit- Repeat Yearly Visit  Subjective:  Paige Chandler is a 35 y.o. [phone]  being seen today for an well woman visit and to discuss family planning options.    She is currently using Depo Provera for pregnancy prevention. Patient reports she does not want a pregnancy in the next year. Patient  has Genital HSV - type 2 and Abnormal Pap smear of cervix on their problem list.  Chief Complaint  Patient presents with  . Annual Exam    Patient reports here for physical and depo   Patient denies any problems and concerns.     See flowsheet for other program required questions.   Body mass index is 31.96 kg/m. - Patient is eligible for diabetes screening based on BMI and age >15?  not applicable HA1C ordered? not applicable  Patient reports 1 of partners in last year. Desires STI screening?  Yes   Has patient been screened once for HCV in the past?  No  No results found for: HCVAB  Does the patient have current of drug use, have a partner with drug use, and/or has been incarcerated since last result? No  If yes-- Screen for HCV through [facility]   Does the patient meet criteria for HBV testing? No  Criteria:  -Household, sexual or needle sharing contact with HBV -History of drug use -HIV positive -Those with known Hep C   Health Maintenance Due  Topic Date Due  . Hepatitis C Screening  Never done  . COVID-19 Vaccine (1) Never done  . TETANUS/TDAP  Never done  . PAP SMEAR-Modifier  11/18/2018  . INFLUENZA VACCINE  Never done    Review of Systems  Constitutional: Negative for chills, fever, malaise/fatigue and weight loss.  HENT: Negative for congestion, hearing loss and sore throat.   Eyes: Negative for blurred vision, double vision and photophobia.  Respiratory: Negative for shortness of breath.   Cardiovascular: Negative for chest pain.  Gastrointestinal: Negative for abdominal pain, blood in stool, constipation, diarrhea, heartburn, nausea and  vomiting.  Genitourinary: Negative for dysuria and frequency.  Musculoskeletal: Negative for back pain, joint pain and neck pain.  Skin: Negative for itching and rash.  Neurological: Negative for dizziness, weakness and headaches.  Endo/Heme/Allergies: Does not bruise/bleed easily.  Psychiatric/Behavioral: Negative for depression, substance abuse and suicidal ideas.    The following portions of the patient's history were reviewed and updated as appropriate: allergies, current medications, past family history, past medical history, past social history, past surgical history and problem list. Problem list updated.  Objective:   Vitals:   05/28/20 0932  BP: 104/71  Pulse: 69  Weight: 186 lb 3.2 oz (84.5 kg)  Height: 5\' 4"  (1.626 m)    Physical Exam Vitals and nursing note reviewed.  Constitutional:      Appearance: Normal appearance.  HENT:     Head: Normocephalic and atraumatic.     Mouth/Throat:     Mouth: Mucous membranes are moist.     Pharynx: No oropharyngeal exudate or posterior oropharyngeal erythema.  Eyes:     General: No scleral icterus. Neck:     Thyroid: No thyroid mass, thyromegaly or thyroid tenderness.  Cardiovascular:     Rate and Rhythm: Normal rate and regular rhythm.     Pulses: Normal pulses.     Heart sounds: Normal heart sounds.  Pulmonary:     Effort: Pulmonary effort is normal.     Breath sounds: Normal breath sounds.  Chest:  Breasts:  Tanner Score is 5.     Right: Normal. No inverted nipple, mass, nipple discharge, skin change or tenderness.     Left: Normal. No inverted nipple, mass, nipple discharge, skin change or tenderness.    Abdominal:     General: Abdomen is flat. Bowel sounds are normal.     Palpations: Abdomen is soft.  Genitourinary:    General: Normal vulva.     Rectum: Normal.     Comments: External genitalia without, lice, nits, erythema, edema , lesions or inguinal adenopathy. Vagina with normal mucosa and discharge and  pH > 4.5.  Cervix without visual lesions, uterus firm, mobile, non-tender, no masses, CMT adnexal fullness or tenderness.   Musculoskeletal:        General: Normal range of motion.     Cervical back: Normal range of motion and neck supple.  Lymphadenopathy:     Cervical: No cervical adenopathy.  Skin:    General: Skin is warm and dry.  Neurological:     General: No focal deficit present.     Mental Status: She is alert and oriented to person, place, and time.  Psychiatric:        Mood and Affect: Mood normal.        Behavior: Behavior normal.       Assessment and Plan:  Paige Chandler is a 35 y.o. female [phone] presenting to the [facility] Department for an yearly well woman exam/family planning visit   1. Smear, vaginal, as part of routine gynecological examination Well woman exam today,  PAP and CBE  Completed   - Pregnancy, urine - IGP, Aptima HPV  2. Screening examination for venereal disease Patient desires screening for STI today, denies symptoms.    - Chlamydia/Gonorrhea [facility] - WET PREP FOR TRICH, YEAST, CLUE - HIV [place] [facility] - Syphilis Serology, [facility]  3. Family planning counseling Contraception counseling: Reviewed all forms of birth control options in the tiered based approach. available including abstinence; over the counter/barrier methods; hormonal contraceptive medication including pill, patch, ring, injection,contraceptive implant, ECP; hormonal and nonhormonal IUDs; permanent sterilization options including vasectomy and the various tubal sterilization modalities. Risks, benefits, and typical effectiveness rates were reviewed.  Questions were answered.  Written information was also given to the patient to review.  Patient desires depo, this was prescribed for patient. She will follow up in 11-13 weeks for surveillance.  She was told to call with any further questions, or with any concerns about this method of contraception.   Emphasized use of condoms 100% of the time for STI prevention.  Patient was not  offered ECP based on date of past depo and negative PT today.   4. Encounter for surveillance of injectable contraceptive  Discussed importance of keeping depo between 11-13 weeks for most effectiveness of depo.  Discussed other BCM options if coming to clinic to get depo was trouble.  Declined and wants to continue depo   - medroxyPROGESTERone (DEPO-PROVERA) injection 150 mg   5. BV (bacterial vaginosis) Wet prep positive amine and clue,   RN treat for BV.   - metroNIDAZOLE (FLAGYL) 500 MG tablet; Take 1 tablet (500 mg total) by mouth 2 (two) times daily for 7 days.  Dispense: 14 tablet; Refill: 0   Return in about 3 months (around 08/28/2020) for depo.  No future appointments.  Wendi Snipes, FNP

## 2020-05-28 NOTE — Progress Notes (Signed)
Wet mount reviewed by Elveria Rising FNP-BC and client treated per her order with Metronidazole. Depo administered per her order and client tolerated without complaint. Depo consent signed today. Jossie Ng, RN

## 2020-06-04 LAB — IGP, APTIMA HPV
HPV Aptima: POSITIVE — AB
PAP Smear Comment: 0

## 2020-06-07 ENCOUNTER — Encounter: Payer: Self-pay | Admitting: Family Medicine

## 2020-06-10 ENCOUNTER — Telehealth: Payer: Self-pay

## 2020-06-10 NOTE — Telephone Encounter (Signed)
Telephone call to patient today regarding her PAP results and the need for a Colpo referral.  Left a message to please return my call.  Hart Carwin, RN

## 2020-06-12 ENCOUNTER — Telehealth: Payer: Self-pay

## 2020-06-12 NOTE — Telephone Encounter (Signed)
Return call by patient yesterday and this morning. No voicemail by patient.  Return call back to patient, but it goes to voicemail.  Left message that I would give her a call in between patients today due to me working clinic. Hart Carwin, RN

## 2020-06-13 ENCOUNTER — Telehealth: Payer: Self-pay

## 2020-06-13 NOTE — Telephone Encounter (Signed)
Telephone call to patient today regarding her PAP results and the need for a Colpo.  Left message to return my call.  Hart Carwin, RN   Patient has returned the call multiple times while I was in clinic seeing patients, but has not left a voicemail either time.   I did attempt to call patient right back one morning before clinic after she had called and patient did not answer the phone. Hart Carwin, RN

## 2020-07-08 ENCOUNTER — Emergency Department: Payer: BC Managed Care – PPO

## 2020-07-08 ENCOUNTER — Other Ambulatory Visit: Payer: Self-pay

## 2020-07-08 ENCOUNTER — Encounter: Payer: Self-pay | Admitting: Emergency Medicine

## 2020-07-08 DIAGNOSIS — M542 Cervicalgia: Secondary | ICD-10-CM | POA: Diagnosis present

## 2020-07-08 DIAGNOSIS — Y92410 Unspecified street and highway as the place of occurrence of the external cause: Secondary | ICD-10-CM | POA: Insufficient documentation

## 2020-07-08 DIAGNOSIS — M25562 Pain in left knee: Secondary | ICD-10-CM | POA: Diagnosis not present
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
## 2020-07-09 ENCOUNTER — Emergency Department
Admission: EM | Admit: 2020-07-09 | Discharge: 2020-07-09 | Disposition: A | Discharge status: Home / Self Care | Payer: BC Managed Care – PPO | Attending: Emergency Medicine | Admitting: Emergency Medicine

## 2020-07-09 DIAGNOSIS — M542 Cervicalgia: Secondary | ICD-10-CM

## 2020-07-09 DIAGNOSIS — M25562 Pain in left knee: Secondary | ICD-10-CM

## 2020-07-09 MED ORDER — NAPROXEN 500 MG PO TABS
500.0000 mg | ORAL_TABLET | Freq: Once | ORAL | Status: AC
  Administered 2020-07-09: 500 mg via ORAL
  Filled 2020-07-09: qty 1

## 2020-07-09 MED ORDER — ACETAMINOPHEN 500 MG PO TABS
1000.0000 mg | ORAL_TABLET | Freq: Once | ORAL | Status: AC
  Administered 2020-07-09: 1000 mg via ORAL
  Filled 2020-07-09: qty 2

## 2020-07-09 MED ORDER — LIDOCAINE 5 % EX PTCH
1.0000 | MEDICATED_PATCH | CUTANEOUS | Status: DC
  Administered 2020-07-09: 1 via TRANSDERMAL
  Filled 2020-07-09: qty 1
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
## 2020-08-01 ENCOUNTER — Telehealth: Payer: Self-pay | Admitting: Nurse Practitioner

## 2020-08-01 NOTE — Telephone Encounter (Signed)
Telephone call to patient.  Patient informed that she will be in need of a Colpo following previous PAP results.  Patient desires to have Colpo done at [facility].  Referral faxed to [facility] on 08/01/2020.  Glenna Fellows, RN

## 2020-10-01 ENCOUNTER — Encounter: Payer: Self-pay | Admitting: Nurse Practitioner

## 2020-10-01 NOTE — Progress Notes (Addendum)
Telephone call to [facility] regarding the status of Colpo appointment or results.  [facility] states they have no records of receiving fax.  Fax and confirmation of Colpo referral on 08/01/20.  Re-faxed referral today.  Will follow up regarding scheduled appointment. Patient's number currently is not in service or working.  Letter mailed to residence encouraging patient to also call and schedule an appointment.  Glenna Fellows, RN

## 2020-10-04 ENCOUNTER — Ambulatory Visit: Payer: BC Managed Care – PPO

## 2020-10-08 ENCOUNTER — Emergency Department
Admission: EM | Admit: 2020-10-08 | Discharge: 2020-10-08 | Disposition: A | Discharge status: Home / Self Care | Payer: BC Managed Care – PPO | Attending: Emergency Medicine | Admitting: Emergency Medicine

## 2020-10-08 ENCOUNTER — Encounter: Payer: Self-pay | Admitting: Emergency Medicine

## 2020-10-08 ENCOUNTER — Ambulatory Visit: Payer: Self-pay

## 2020-10-08 ENCOUNTER — Other Ambulatory Visit: Payer: Self-pay

## 2020-10-08 DIAGNOSIS — Z20822 Contact with and (suspected) exposure to covid-19: Secondary | ICD-10-CM | POA: Insufficient documentation

## 2020-10-08 DIAGNOSIS — Z3042 Encounter for surveillance of injectable contraceptive: Secondary | ICD-10-CM

## 2020-10-08 DIAGNOSIS — J1089 Influenza due to other identified influenza virus with other manifestations: Secondary | ICD-10-CM | POA: Insufficient documentation

## 2020-10-08 DIAGNOSIS — J101 Influenza due to other identified influenza virus with other respiratory manifestations: Secondary | ICD-10-CM

## 2020-10-08 LAB — RESP PANEL BY RT-PCR (FLU A&B, COVID) ARPGX2
Influenza A by PCR: POSITIVE — AB
Influenza B by PCR: NEGATIVE
SARS Coronavirus 2 by RT PCR: NEGATIVE

## 2020-10-08 LAB — GROUP A STREP BY PCR: Group A Strep by PCR: NOT DETECTED

## 2020-10-08 MED ORDER — OSELTAMIVIR PHOSPHATE 75 MG PO CAPS: 75.0000 mg | ORAL_CAPSULE | Freq: Two times a day (BID) | ORAL | 0 refills | Status: AC

## 2020-10-08 NOTE — ED Triage Notes (Signed)
C/O fever/ chills since Sunday.  Last medicated with Tylenol last night.  AAOx3.  Skin warm and dry. NAD

## 2020-10-08 NOTE — Discharge Instructions (Addendum)
Please take Tylenol and ibuprofen as needed for fever and body aches.  You have been diagnosed with flu.  You may take Tamiflu as prescribed.  If you develop severe GI upset, you can discontinue the medicine.  If you develop any worsening, please return to the emergency department, otherwise follow-up with primary care as needed.

## 2020-10-08 NOTE — ED Notes (Signed)
See triage note   States developed fever ,chills,congestion and body aches  Afebrile on arrival   Sx's started on Sunday

## 2020-10-08 NOTE — ED Provider Notes (Signed)
[facility] Emergency Department Provider Note  ____________________________________________   Event Date/Time   First MD Initiated Contact with Patient 10/08/20 [phone]     (approximate)  I have reviewed the triage vital signs and the nursing notes.   HISTORY  Chief Complaint Fever   HPI Paige Chandler is a 35 y.o. female who presents to the ER for evaluation of intermittent fever, chills and body aches that began late Sunday night.  Patient states that T-max was 101.  She denies any shortness of breath, chest pain, abdominal pain, dysuria, nausea vomiting or diarrhea.  She has been treating her symptoms with Tylenol, last dose was last night.  She states that all of her friends have similar symptoms, they were all together on Thursday and multiple of them have now tested positive for influenza.         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 Authorizing Provider  oseltamivir (TAMIFLU) 75 MG capsule Take 1 capsule (75 mg total) by mouth 2 (two) times daily for 5 days. 10/08/20 10/13/20 Yes Lucy Chris, PA    Allergies Tramadol  Family History  Problem Relation Age of Onset   Hypertension Father    Seizures Son    Other Son        congenital HSV   Hypertension Paternal Grandmother    Cancer Paternal Grandfather    Breast cancer Neg Hx     Social History Social History   Tobacco Use   Smoking status: Never   Smokeless tobacco: Never  Vaping Use   Vaping Use: Never used   Substance Use Topics   Alcohol use: Yes    Comment: Last ETOH use 05/25/2020   Drug use: Not Currently    Comment: Last used marijuana "in my 20's"    Review of Systems Constitutional: + fever/chills, + body aches Eyes: No visual changes. ENT: No sore throat. Cardiovascular: Denies chest pain. Respiratory: Denies shortness of breath. Gastrointestinal: No abdominal pain.  No nausea, no vomiting.  No diarrhea.  No constipation. Genitourinary: Negative for dysuria. Musculoskeletal: Negative for back pain. Skin: Negative for rash. Neurological: Negative for headaches, focal weakness or numbness.   ____________________________________________   PHYSICAL EXAM:  VITAL SIGNS: ED Triage Vitals  Enc Vitals Group     BP 10/08/20 0958 (!) 145/91     Pulse Rate 10/08/20 0958 98     Resp 10/08/20 0958 16     Temp 10/08/20 0958 98.8 F (37.1 C)     Temp Source 10/08/20 0958 Oral     SpO2 10/08/20 0958 98 %     Weight 10/08/20 0950 190 lb 0.6 oz (86.2 kg)     Height 10/08/20 0950 5\' 4"  (1.626 m)     Head Circumference --      Peak Flow --      Pain Score 10/08/20 0950 0     Pain Loc --      Pain Edu? --  Excl. in GC? --    Constitutional: Alert and oriented. Well appearing and in no acute distress. Eyes: Conjunctivae are normal. PERRL. EOMI. Head: Atraumatic. Nose: No congestion/rhinnorhea. Mouth/Throat: Mucous membranes are moist.  Oropharynx erythematous with mild tonsillar enlargement bilaterally with exudates present. Neck: No stridor.   Lymphatic: Shotty cervical lymphadenopathy Cardiovascular: Normal rate, regular rhythm. Grossly normal heart sounds.  Good peripheral circulation. Respiratory: Normal respiratory effort.  No retractions. Lungs CTAB. Gastrointestinal: Soft and nontender. No distention. No abdominal bruits. No CVA tenderness. Musculoskeletal: No lower extremity tenderness nor edema.  No joint effusions. Neurologic:  Normal speech and language. No gross  focal neurologic deficits are appreciated. No gait instability. Skin:  Skin is warm, dry and intact. No rash noted. Psychiatric: Mood and affect are normal. Speech and behavior are normal.  ____________________________________________   LABS (all labs ordered are listed, but only abnormal results are displayed)  Labs Reviewed  RESP PANEL BY RT-PCR (FLU A&B, COVID) ARPGX2 - Abnormal; Notable for the following components:      Result Value   Influenza A by PCR POSITIVE (*)    All other components within normal limits  GROUP A STREP BY PCR     ____________________________________________   INITIAL IMPRESSION / ASSESSMENT AND PLAN / ED COURSE  As part of my medical decision making, I reviewed the following data within the electronic MEDICAL RECORD NUMBER Nursing notes reviewed and incorporated, Labs reviewed, and Notes from prior ED visits        Patient is a 35 year old female who presents to the emergency department with about 36 hours of body aches and intermittent fever with chills.  See HPI for further details.  Sick contact including influenza.  In triage patient is mildly hypertensive but otherwise has normal vital signs.  Physical exam as above.  Laboratory testing included strep as well as COVID/flu.  Patient is negative for strep pharyngitis, positive for influenza.  Discussed treatment options and patient elected to proceed with Tamiflu.  This was sent to her pharmacy.  Return precautions were discussed and she stable this time for outpatient follow-up.      ____________________________________________   FINAL CLINICAL IMPRESSION(S) / ED DIAGNOSES  Final diagnoses:  Influenza A  Encounter for surveillance of injectable contraceptive     ED Discharge Orders          Ordered    oseltamivir (TAMIFLU) 75 MG capsule  2 times daily        10/08/20 1142             Note:  This document was prepared using Dragon voice recognition software and may include  unintentional dictation errors.    Lucy Chris, PA 10/08/20 1500    Minna Antis, MD 10/08/20 1530

## 2020-10-30 ENCOUNTER — Encounter: Payer: Self-pay | Admitting: Nurse Practitioner

## 2020-10-30 ENCOUNTER — Ambulatory Visit (LOCAL_COMMUNITY_HEALTH_CENTER): Payer: 59 | Admitting: Advanced Practice Midwife

## 2020-10-30 ENCOUNTER — Other Ambulatory Visit: Payer: Self-pay

## 2020-10-30 VITALS — BP 113/79 | Ht 64.0 in | Wt 199.8 lb

## 2020-10-30 DIAGNOSIS — R87613 High grade squamous intraepithelial lesion on cytologic smear of cervix (HGSIL): Secondary | ICD-10-CM

## 2020-10-30 DIAGNOSIS — Z3042 Encounter for surveillance of injectable contraceptive: Secondary | ICD-10-CM | POA: Diagnosis not present

## 2020-10-30 DIAGNOSIS — Z3009 Encounter for other general counseling and advice on contraception: Secondary | ICD-10-CM | POA: Diagnosis not present

## 2020-10-30 LAB — PREGNANCY, URINE: Preg Test, Ur: NEGATIVE

## 2020-10-30 NOTE — Progress Notes (Signed)
Depo given and tolerated well. Stressed importance of scheduling and keeping every 11-13 week Depo appts. Tawny Hopping, RN

## 2020-10-30 NOTE — Progress Notes (Signed)
Contraception/Family Planning VISIT ENCOUNTER NOTE  Subjective:   Paige Chandler is a 35 y.o.SBF [id number](63, 9) nonsmoker female here for reproductive life counseling.  Desires continuation of DMPA for BCM.  Reports she does not want a pregnancy in the next year. Denies abnormal vaginal bleeding, discharge, pelvic pain, problems with intercourse or other gynecologic concerns. Last PE 05/28/20. Last DMPA 05/28/20. Last pap 05/28/20 HSIL +HPV. Can't remember LMP. Last sex 07/2020 with condom;first time with that partner. Denies cigs, vaping, cigars, MJ. Last ETOH 10/26/20 (3 shots Petrone) weekends. Not in school. Employed 40 hrs/wk. Living with her 2 kids and her parents.   Gynecologic History No LMP recorded. Patient has had an injection. Contraception: abstinence  Health Maintenance Due  Topic Date Due   Hepatitis C Screening  Never done   TETANUS/TDAP  Never done   COVID-19 Vaccine (2 - Pfizer series) 10/16/2020   INFLUENZA VACCINE  10/28/2020     The following portions of the patient's history were reviewed and updated as appropriate: allergies, current medications, past family history, past medical history, past social history, past surgical history and problem list.  Review of Systems Pertinent items are noted in HPI.   Objective:  BP 113/79   Ht 5\' 4"  (1.626 m)   Wt 199 lb 12.8 oz (90.6 kg)   BMI 34.30 kg/m  Gen: well appearing, NAD HEENT: no scleral icterus CV: RR Lung: Normal WOB Ext: warm well perfused     Assessment and Plan:   Contraception counseling: Reviewed all forms of birth control options in the tiered based approach. available including abstinence; over the counter/barrier methods; hormonal contraceptive medication including pill, patch, ring, injection,contraceptive implant, ECP; hormonal and nonhormonal IUDs; permanent sterilization options including vasectomy and the various tubal sterilization modalities. Risks, benefits, and typical effectiveness rates were  reviewed.  Questions were answered.  Written information was also given to the patient to review.  Patient desires DMPA, this was prescribed for patient. She will follow up in   11-13 wks for surveillance.  She was told to call with any further questions, or with any concerns about this method of contraception.  Emphasized use of condoms 100% of the time for STI prevention.  Patient was offered ECP. ECP was not accepted by the patient. ECP counseling was not given - see RN documentation  1. Family planning  - Pregnancy, urine  2. Encounter for surveillance of injectable contraceptive If PT neg today may have DMPA 150 mg IM q 11-13 wks per 05/28/20 order  3. HGSIL (high grade squamous intraepithelial lesion) on Pap smear of cervix 05/28/20 Pt counseled that she needed colpo but we were unable to reach pt. 07/28/20 in to talk to pt and obtain contact info; she told pt to call [facility] and schedule colpo asap.    Please refer to After Visit Summary for other counseling recommendations.   Return for 11-13 wk DMPA.  [facility], CNM [facility] DEPARTMENT

## 2020-10-30 NOTE — Progress Notes (Signed)
Patient seen in family planning clinic today for a visit.  Discussed PAP results and Colpo referral.  Patient states that she has been without a phone for over 2 months.  Stressed the importance of the procedure.  Patient stated that she will either stop by the office to schedule Colpo appointment or use a family member telephone to make the appointment.  Will follow up regarding scheduling.  Glenna Fellows, RN

## 2020-10-30 NOTE — Progress Notes (Signed)
Here today for Depo. Last PE, Pap Smear (HSIL and HPV+) and Depo was 05/28/2020 (22.1 weeks.) PT today. Per Epic Colpo referral faxed to [facility] 08/18/20 and 10/01/20. Per Epic notes a letter was mailed to patient asking to call [facility] to schedule needed Colpo appt. PT negative. Tawny Hopping, RN

## 2020-12-12 ENCOUNTER — Encounter: Payer: Self-pay | Admitting: Nurse Practitioner

## 2020-12-12 NOTE — Progress Notes (Signed)
Received notification from [facility].  Notification stated that they were unsuccessful  in reaching the patient in scheduling a Colpo appointment.  Will send a letter to the patient regarding appointment.  Glenna Fellows, RN

## 2021-03-13 ENCOUNTER — Ambulatory Visit: Payer: 59

## 2021-03-13 ENCOUNTER — Ambulatory Visit: Payer: Self-pay

## 2021-06-27 ENCOUNTER — Encounter: Payer: Self-pay | Admitting: Family Medicine

## 2021-06-27 ENCOUNTER — Ambulatory Visit (LOCAL_COMMUNITY_HEALTH_CENTER): Payer: 59 | Admitting: Family Medicine

## 2021-06-27 VITALS — BP 131/85 | Ht 64.0 in | Wt 207.2 lb

## 2021-06-27 DIAGNOSIS — Z1272 Encounter for screening for malignant neoplasm of vagina: Secondary | ICD-10-CM

## 2021-06-27 DIAGNOSIS — Z3009 Encounter for other general counseling and advice on contraception: Secondary | ICD-10-CM

## 2021-06-27 DIAGNOSIS — Z01419 Encounter for gynecological examination (general) (routine) without abnormal findings: Secondary | ICD-10-CM

## 2021-06-27 DIAGNOSIS — Z30013 Encounter for initial prescription of injectable contraceptive: Secondary | ICD-10-CM

## 2021-06-27 DIAGNOSIS — Z3202 Encounter for pregnancy test, result negative: Secondary | ICD-10-CM

## 2021-06-27 DIAGNOSIS — Z3200 Encounter for pregnancy test, result unknown: Secondary | ICD-10-CM

## 2021-06-27 DIAGNOSIS — Z113 Encounter for screening for infections with a predominantly sexual mode of transmission: Secondary | ICD-10-CM

## 2021-06-27 LAB — PREGNANCY, URINE: Preg Test, Ur: NEGATIVE

## 2021-06-27 LAB — WET PREP FOR TRICH, YEAST, CLUE
Trichomonas Exam: NEGATIVE
Yeast Exam: NEGATIVE

## 2021-06-27 MED ORDER — MEDROXYPROGESTERONE ACETATE 150 MG/ML IM SUSP
150.0000 mg | INTRAMUSCULAR | Status: AC
  Administered 2021-06-27 – 2021-10-29 (×2): 150 mg via INTRAMUSCULAR

## 2021-06-27 NOTE — Progress Notes (Signed)
Pt seen for restart of Depo. PT negative. Pap smear and STD testing. Depo given L Glute.  Lethea Killings RN ?

## 2021-06-27 NOTE — Progress Notes (Signed)
[facility] ?Family Planning Clinic ?[address] ?Main Number: [phone] ? ?Family Planning Visit- Repeat Yearly Visit ? ?Subjective:  ?Paige BaarsChanta N Chandler is a 36 y.o. [phone]  being seen today for an annual wellness visit and to discuss contraception options.   The patient is currently using No Method - No Contraceptive Precautions for pregnancy prevention. Patient does not want a pregnancy in the next year.  ? ? report they are looking for a method that provides High efficacy at preventing pregnancy, Minimal bleeding/improved bleeding profile, Discrete method, and Methods that does not involve too much memory ? ? 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 ? ?Chief Complaint  ?Patient presents with  ? Annual Exam  ?  Restart DEPO  ? ? ?Patient reports here for physical and wants to start Depo  ? ?Patient denies any other problems or concerns  ? ?See flowsheet for other program required questions.  ? ?Body mass index is 35.57 kg/m?. - Patient is eligible for diabetes screening based on BMI and age 43>40?  not applicable ?HA1C ordered? not applicable ? ?Patient reports 1 of partners in last year. Desires STI screening?  Yes ? ? ?Has patient been screened once for HCV in the past?  No ? No results found for: HCVAB ? ?Does the patient have current of drug use, have a partner with drug use, and/or has been incarcerated since last result? No  ?If yes-- Screen for HCV through [facility] ?  ?Does the patient meet criteria for HBV testing? No ? ?Criteria:  ?-Household, sexual or needle sharing contact with HBV ?-History of drug use ?-HIV positive ?-Those with known Hep C ? ? ?Health Maintenance Due  ?Topic Date Due  ? Hepatitis C Screening  Never done  ? TETANUS/TDAP  04/21/2017  ? COVID-19 Vaccine (2 - Pfizer series) 10/16/2020  ? INFLUENZA VACCINE   Never done  ? PAP SMEAR-Modifier  05/28/2021  ? ? ?Review of Systems  ?Constitutional:  Negative for chills, fever, malaise/fatigue and weight loss.  ?HENT:  Negative for congestion, hearing loss and sore throat.   ?Eyes:  Negative for blurred vision, double vision and photophobia.  ?Respiratory:  Negative for shortness of breath.   ?Cardiovascular:  Negative for chest pain.  ?Gastrointestinal:  Negative for abdominal pain, blood in stool, constipation, diarrhea, heartburn, nausea and vomiting.  ?Genitourinary:  Negative for dysuria and frequency.  ?Musculoskeletal:  Negative for back pain, joint pain and neck pain.  ?Skin:  Negative for itching and rash.  ?Neurological:  Negative for dizziness, weakness and headaches.  ?Endo/Heme/Allergies:  Does not bruise/bleed easily.  ?Psychiatric/Behavioral:  Negative for depression, substance abuse and suicidal ideas.   ? ?The following portions of the patient's history were reviewed and updated as appropriate: allergies, current medications, past family history, past medical history, past social history, past surgical history and problem list. Problem list updated. ? ?Objective:  ? ?Vitals:  ? 06/27/21 0921  ?BP: 131/85  ?Weight: 207 lb 3.2 oz (94 kg)  ?Height: 5\' 4"  (1.626 m)  ? ? ?Physical Exam ?Vitals and nursing note reviewed.  ?Constitutional:   ?   Appearance: Normal appearance.  ?HENT:  ?   Head: Normocephalic and atraumatic.  ?   Mouth/Throat:  ?   Mouth: Mucous membranes are moist.  ?   Dentition: Normal dentition. No dental  caries.  ?   Pharynx: No oropharyngeal exudate or posterior oropharyngeal erythema.  ?Eyes:  ?   General: No scleral icterus. ?Neck:  ?   Thyroid: No thyroid mass, thyromegaly or thyroid tenderness.  ?Cardiovascular:  ?   Rate and Rhythm: Normal rate and regular rhythm.  ?   Pulses: Normal pulses.  ?   Heart sounds: Normal heart sounds.  ?Pulmonary:  ?   Effort: Pulmonary effort is normal.  ?   Breath sounds: Normal breath sounds.  ?Chest:   ?Breasts: ?   Tanner Score is 5.  ?   Right: Normal.  ?   Left: Normal.  ?Abdominal:  ?   General: Abdomen is flat. Bowel sounds are normal.  ?   Palpations: Abdomen is soft.  ?Genitourinary: ?   General: Normal vulva.  ?   Rectum: Normal.  ?   Comments: External genitalia without, lice, nits, erythema, edema , lesions or inguinal adenopathy. Vagina with normal mucosa and white discharge and pH equals 4.  Cervix without visual lesions, uterus firm, mobile, non-tender, no masses, CMT adnexal fullness or tenderness.   ?Musculoskeletal:     ?   General: Normal range of motion.  ?   Cervical back: Normal range of motion and neck supple.  ?Skin: ?   General: Skin is warm and dry.  ?Neurological:  ?   General: No focal deficit present.  ?   Mental Status: She is alert.  ? ? ? ? ?Assessment and Plan:  ?Paige Chandler is a 36 y.o. female [phone] presenting to the [facility] for an yearly wellness and contraception visit ? ? ?Contraception counseling: Reviewed options based on patient desire and reproductive life plan. Patient is interested in Hormonal Injection. This was provided to the patient today.  ? ?Risks, benefits, and typical effectiveness rates were reviewed.  Questions were answered.  Written information was also given to the patient to review.   ? ?The patient will follow up in  1 years for surveillance.  The patient was told to call with any further questions, or with any concerns about this method of contraception.  Emphasized use of condoms 100% of the time for STI prevention. ? ?Patient was assessed for need for ECP. Patient was offered ECP based on > 120 hours .  Patient is within 30 days of unprotected sex. Patient was offered ECP. Reviewed options and patient desired No method of ECP, declined all   ? ? ?1. Encounter for pregnancy test, result unknown ?Neg  ?- Pregnancy, urine ? ?2. Smear, vaginal, as part of routine gynecological examination ?Well woman exam  ?Pap today, history  of HPV  ?CBE due 2025 ? ?- IGP, Aptima HPV ? ?3. Encounter for initial prescription of injectable contraceptive ?Desires to start DMPA ?Ok for DMPA 150 mg IM every 11-13 weeks x 1 year.  ?- medroxyPROGESTERone (DEPO-PROVERA) injection 150 mg ? ?4. Screening examination for venereal disease ?Patient accepted all screenings including wet prep,  vaginal CT/GC and declined bloodwork for HIV/RPR. ? ?Wet prep results neg    ?Treatment needed  ?Discussed time line for State Lab results and that patient will be called with positive results and encouraged patient to call if she had not heard in 2 weeks.  ?Counseled to return or seek care for continued or worsening symptoms ?Recommended condom use with all sex ? ?- Chlamydia/Gonorrhea [facility] ?- WET PREP FOR TRICH, YEAST, CLUE ? ? ?Return in about 11 weeks (around 09/12/2021)  for depo. ? ?No future appointments. ? ?Wendi Snipes, FNP ? ?

## 2021-07-08 ENCOUNTER — Telehealth: Payer: Self-pay | Admitting: Family Medicine

## 2021-07-08 LAB — IGP, APTIMA HPV
HPV Aptima: POSITIVE — AB
PAP Smear Comment: 0

## 2021-07-16 ENCOUNTER — Telehealth: Payer: Self-pay

## 2021-07-16 NOTE — Telephone Encounter (Signed)
Telephone call to patient regarding her PAP results HSIl and + HPV and the need for a Colpo referral.  Left message for patient to return my call at [phone].  Hart Carwin, RN ? ? ? ?

## 2021-07-18 NOTE — Telephone Encounter (Signed)
Follow-Up for Abnormal PAP Smear letter mailed today via Certified Mail.  [phone]  [phone].  Hart Carwin, RN ? ?

## 2021-08-21 ENCOUNTER — Telehealth: Payer: Self-pay

## 2021-08-21 NOTE — Telephone Encounter (Signed)
Telephone call to paitent today regarding her PAP results (HSIL and + HPV).  Colpo referral needed and patient desires to go to [facility]  [facility] referral completed today with confirmation. Hart Carwin, RN

## 2021-08-28 ENCOUNTER — Telehealth: Payer: Self-pay

## 2021-08-28 NOTE — Telephone Encounter (Signed)
Telephone call to [facility] to inquire about Colpo referral sent on 08-21-2021 for HSIL and  + HPV.  Clerical staff tried to check with referral coordinator and she had left at 3 pm.  She left a message for her to return my call in the morning.  Hart Carwin, RN

## 2021-08-29 NOTE — Telephone Encounter (Signed)
Telephone call to patient regarding the information [facility] shared with me about her bad debt.  I would like to clarify her insurance coverage and possibly another alternate plan if she cannot pay her debt prior to being scheduled for a Colpo. Left a message for patient to return my call at [phone].  Dahlia Bailiff, RN

## 2021-08-29 NOTE — Telephone Encounter (Signed)
Telephone call to [facility] this morning to follow-up on Colpo referral.  Spoke to [facility] and she said they did receive the referral, but patient had a bad debt and would need to pay $225 before they would schedule her a Colpo appointment.  I inquired about patient being aware of this and she said patient had been notified. Hart Carwin, RN

## 2021-09-02 NOTE — Telephone Encounter (Signed)
Telephone call to patient this afternoon regarding her Colpo at [facility].  Left message to return my call at [phone].  Hart Carwin, RN

## 2021-09-05 NOTE — Telephone Encounter (Signed)
Telephone call to patient regarding information I received from [facility]  regarding her Colpo.  Per [facility], patient owes money on a bad debt and she is to pay $225 prior to scheduling her Colpo.  Pateint is unaware of any balance.  She does report having insurance.  Patient advised to call [facility] today with number provided to see if [facility] will schedule her Colpo or enlighten her with information regarding the balance owed.  Patient has an abnormal (HSIL and + HPV) that needs attention as soon as possible.  Patient aware of the need to move forward as quick as possible related to this abnormal PAP and she will call me back letting me know what the plan is.  Hart Carwin, RN

## 2021-09-19 ENCOUNTER — Ambulatory Visit
Admission: EM | Admit: 2021-09-19 | Discharge: 2021-09-19 | Disposition: A | Discharge status: Home / Self Care | Payer: BC Managed Care – PPO | Attending: Nurse Practitioner | Admitting: Nurse Practitioner

## 2021-09-19 DIAGNOSIS — J309 Allergic rhinitis, unspecified: Secondary | ICD-10-CM

## 2021-09-19 DIAGNOSIS — J020 Streptococcal pharyngitis: Secondary | ICD-10-CM

## 2021-09-19 MED ORDER — PSEUDOEPH-BROMPHEN-DM 30-2-10 MG/5ML PO SYRP: 5.0000 mL | ORAL_SOLUTION | Freq: Four times a day (QID) | ORAL | 0 refills | Status: AC | PRN

## 2021-09-19 MED ORDER — CETIRIZINE HCL 10 MG PO TABS: 10.0000 mg | ORAL_TABLET | Freq: Every day | ORAL | 0 refills | Status: AC

## 2021-09-19 MED ORDER — PREDNISONE 20 MG PO TABS: 20.0000 mg | ORAL_TABLET | Freq: Every day | ORAL | 0 refills | Status: AC

## 2021-09-19 NOTE — ED Triage Notes (Signed)
Pt presents with c/o nasal congestion and cough for past few days, states she is being treated for strep with amoxicillin since Monday

## 2021-10-01 NOTE — Telephone Encounter (Signed)
All current conversation/information routed to Lyndel Safe, MD to advise for further action due to DX: HSIL and + HPV and cc: Drema Pry, Nursing Supervisor. Hart Carwin, RN

## 2021-10-01 NOTE — Telephone Encounter (Signed)
Telephone call to [facility] today to inquire about contact made by patient to discuss account balance so her Colpo could be scheduled.  Per clerical staff, there is no documented phone notes regarding such nor has patient scheduled or cancelled a Colpo appointment.  Hart Carwin, RN

## 2021-10-29 ENCOUNTER — Ambulatory Visit (LOCAL_COMMUNITY_HEALTH_CENTER): Payer: BC Managed Care – PPO

## 2021-10-29 VITALS — BP 124/92 | Ht 64.0 in | Wt 204.5 lb

## 2021-10-29 DIAGNOSIS — Z30013 Encounter for initial prescription of injectable contraceptive: Secondary | ICD-10-CM

## 2021-10-29 DIAGNOSIS — Z3009 Encounter for other general counseling and advice on contraception: Secondary | ICD-10-CM

## 2021-10-29 DIAGNOSIS — Z3042 Encounter for surveillance of injectable contraceptive: Secondary | ICD-10-CM

## 2021-10-29 NOTE — Progress Notes (Signed)
17 weeks 5 days post depo. Wants to restart depo. BP elevated today 124/92 and reports elevated BP readings in past.  States no PCP.  Pt states last sex "years ago", but when questioned further, admits to sex with female 03/2021 and adamantly denies sex with female with last time "years ago." Pt states she does not want more children. Expresses interest in sterilization resources. Local Sterilization resource info sheet given.   Consult A White, FNP who declines to order preg test today based on pt's report of no recent sex. Verbal order given to restart depo based on order by Elveria Rising, FNP on 06/27/21. Advises RN to counsel pt to establish PCP and to have BP evaluated.   RN carried out provider orders. Local PCP resource list given. Depo administered RUOQ and tolerated well. Next depo due 01/14/2022 and counseled to adhere to 11 to 13 week intervals between depo for optimal benefit. Questions answered and reports understanding. Jerel Shepherd, RN

## 2021-10-30 NOTE — Progress Notes (Signed)
Consulted by RN re: patient situation.  Reviewed RN note and agree that it reflects our discussion and my recommendations. Harjit Leider, FNP  

## 2021-11-04 NOTE — Telephone Encounter (Signed)
Telephone call to patient today regarding consult with Dr. Alvester Morin, MD about patient options for Colpo outside of [facility].  Left message for patient to return my call at [phone] to discuss options per consult with Dr. Alvester Morin, MD.  Hart Carwin, RN

## 2021-11-05 NOTE — Telephone Encounter (Signed)
Telephone call to patient today regarding her Colpo referral.  Left message to please give me a call to discuss her options regarding her Colpo.  Number provided.  Hart Carwin, RN

## 2021-11-11 NOTE — Telephone Encounter (Signed)
Telephone call to patient today.  Left message extending the invitation to refer her to other offices in the area if she desires.  Please return my call at [phone].  No response to previous messages left nor the importance of the certified letter mailed. Medical Director, Lyndel Safe, MD aware of situation given the level of abnormal PAP.  Hart Carwin, RN

## 2021-11-12 NOTE — Telephone Encounter (Signed)
Close to PAP f/u until patient initiates contact with [facility] regarding her Colpo referral.  Hart Carwin, RN

## 2021-11-12 NOTE — Telephone Encounter (Signed)
Telephone call to [facility] today to inquire if patient had reached out to arrange a plan for finances and to schedule her Colpo.  Spoke with Shanda Bumps in the [facility] GYN Department and patient has not paid anything since 2022.  She still owes a lot of money on her last Colpo.  No appointment has been scheduled at this time.  I explained to Shanda Bumps that the patient and I have been in contact over the last 2 months about the conversation about what needs to happen at [facility] prior to her Colpo being scheduled and that I had encouraged the patient to call them as soon as possible to make arrangements and to to schedule her Colpo. I also told Shanda Bumps rhe patient had not returned my call lately and I had left her message offering to refer her somewhere else for her Colpo. Hart Carwin, RN

## 2022-02-25 ENCOUNTER — Ambulatory Visit: Payer: BC Managed Care – PPO

## 2022-03-27 ENCOUNTER — Ambulatory Visit: Payer: BC Managed Care – PPO

## 2022-04-15 ENCOUNTER — Ambulatory Visit: Payer: BC Managed Care – PPO
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
## 2022-06-02 ENCOUNTER — Ambulatory Visit: Payer: BC Managed Care – PPO

## 2022-06-09 ENCOUNTER — Ambulatory Visit: Payer: BC Managed Care – PPO

## 2022-07-03 ENCOUNTER — Encounter: Payer: Self-pay | Admitting: Advanced Practice Midwife

## 2022-07-03 ENCOUNTER — Ambulatory Visit (LOCAL_COMMUNITY_HEALTH_CENTER): Payer: BC Managed Care – PPO | Admitting: Advanced Practice Midwife

## 2022-07-03 VITALS — BP 127/86 | HR 89 | Temp 98.9°F | Ht 64.0 in | Wt 198.6 lb

## 2022-07-03 DIAGNOSIS — Z30013 Encounter for initial prescription of injectable contraceptive: Secondary | ICD-10-CM

## 2022-07-03 DIAGNOSIS — R87613 High grade squamous intraepithelial lesion on cytologic smear of cervix (HGSIL): Secondary | ICD-10-CM

## 2022-07-03 DIAGNOSIS — Z3009 Encounter for other general counseling and advice on contraception: Secondary | ICD-10-CM

## 2022-07-03 DIAGNOSIS — Z308 Encounter for other contraceptive management: Secondary | ICD-10-CM | POA: Diagnosis not present

## 2022-07-03 DIAGNOSIS — E669 Obesity, unspecified: Secondary | ICD-10-CM | POA: Insufficient documentation

## 2022-07-03 LAB — WET PREP FOR TRICH, YEAST, CLUE
Trichomonas Exam: NEGATIVE
Yeast Exam: NEGATIVE

## 2022-07-03 MED ORDER — MEDROXYPROGESTERONE ACETATE 150 MG/ML IM SUSP
150.0000 mg | INTRAMUSCULAR | Status: AC
  Administered 2022-07-03: 150 mg via INTRAMUSCULAR

## 2022-07-03 NOTE — Progress Notes (Signed)
Wet prep reviewed - negative results. Glendel Jaggers, RN  

## 2022-07-03 NOTE — Progress Notes (Signed)
Next Depo due date reminder card given. Paige Chandler

## 2022-07-03 NOTE — Progress Notes (Addendum)
[facility] [address] Main Number: [phone]    Family Planning Visit- Initial Visit  Subjective:  Kari BaarsChanta N Stanbery is a 37 y.o. SBF  [id number] (17, 11)  being seen today for an initial annual visit and to discuss reproductive life planning.  The patient is currently using No Method - Other Reason for pregnancy prevention. Patient reports   does not want a pregnancy in the next year.     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  Chief Complaint  Patient presents with   Annual Exam    Patient reports here for physical, pap, DMPA restart. LMP 06/21/22. Last sex 03/2022 with condom and not with that partner anymore. Wants to restart DMPA due to cramping with menses. Last MJ age 37. Last ETOH last night (4 shots Tequila) 2x/wk. Last dental exam 03/2022. Working 40 hrs/wk and living with her kids. Last DMPA 10/29/21. Last pap 06/27/21 HGSIL HPV+ with no colpo. 05/28/20 HGSIL, colpo 06/20/19 neg. Last PE 06/18/18  Patient denies cigs, vaping, cigars  Body mass index is 34.09 kg/m. - Patient is eligible for diabetes screening based on BMI> 25 and age >35?  yes HA1C ordered? Pt declines  Patient reports 2  partner/s in last year. Desires STI screening?  No - pt declines blood draw  Has patient been screened once for HCV in the past?  No  No results found for: "HCVAB"  Does the patient have current drug use (including MJ), have a partner with drug use, and/or has been incarcerated since last result? No  If yes-- Screen for HCV through [facility]   Does the patient meet criteria for HBV testing? No  Criteria:  -Household, sexual or needle sharing contact with HBV -History of drug use -HIV positive -Those  with known Hep C   Health Maintenance Due  Topic Date Due   Hepatitis C Screening  Never done   DTaP/Tdap/Td (2 - Td or Tdap) 04/21/2017   COVID-19 Vaccine (2 - 2023-24 season) 11/28/2021   PAP SMEAR-Modifier  06/28/2022    Review of Systems  All other systems reviewed and are negative.   The following portions of the patient's history were reviewed and updated as appropriate: allergies, current medications, past family history, past medical history, past social history, past surgical history and problem list. Problem list updated.   See flowsheet for other program required questions.  Objective:   Vitals:   07/03/22 1517  BP: 127/86  Pulse: 89  Temp: 98.9 F (37.2 C)  Weight: 198 lb 9.6 oz (90.1 kg)  Height: 5\' 4"  (1.626 m)    Physical Exam Constitutional:      Appearance: Normal appearance. She is obese.  HENT:     Head: Normocephalic and atraumatic.     Mouth/Throat:     Mouth: Mucous membranes are moist.     Comments: Last dental exam 03/2022 Pt concerned about her lips; sl hypopigmentation where lips touch each other without lesions, erythema, nontender, applied vaseline or carmex daily to dry lips; referred to dentist Eyes:     Conjunctiva/sclera: Conjunctivae normal.  Neck:     Thyroid: No thyroid mass, thyromegaly or thyroid tenderness.  Cardiovascular:     Rate and Rhythm: Normal rate  and regular rhythm.  Pulmonary:     Effort: Pulmonary effort is normal.     Breath sounds: Normal breath sounds.  Abdominal:     Palpations: Abdomen is soft.     Comments: Soft without masses or tenderness, fair tone  Genitourinary:    General: Normal vulva.     Exam position: Lithotomy position.     Vagina: Vaginal discharge (light brown milky leukorrhea, ph<4.5) present.     Cervix: Friability (friable to pap) present.     Uterus: Normal.      Adnexa: Right adnexa normal and left adnexa normal.     Rectum: Normal.     Comments: Pap done Musculoskeletal:         General: Normal range of motion.     Cervical back: Normal range of motion and neck supple.  Skin:    General: Skin is warm and dry.  Neurological:     Mental Status: She is alert.  Psychiatric:        Mood and Affect: Mood normal.      Assessment and Plan:  JIMENA VASSEUR is a 37 y.o. female presenting to the [facility] Department for an initial annual wellness/contraceptive visit  Contraception counseling: Reviewed options based on patient desire and reproductive life plan. Patient is interested in Hormonal Injection. This was provided to the patient today.  if not why not clearly documented  Risks, benefits, and typical effectiveness rates were reviewed.  Questions were answered.  Written information was also given to the patient to review.    The patient will follow up in  11-13 wks weeks for surveillance.  The patient was told to call with any further questions, or with any concerns about this method of contraception.  Emphasized use of condoms 100% of the time for STI prevention.  Need for ECP was assessed. Patient reported not meeting criteria.  Reviewed options and patient desired No method of ECP, declined all    1. Family planning Pt desires DMPA 150 mg IM q 11-13 wks x 1 year Please counsel on need for abstinance next 7 days  - WET PREP FOR TRICH, YEAST, CLUE - Chlamydia/Gonorrhea [place] [facility] - IGP, Aptima HPV - medroxyPROGESTERone (DEPO-PROVERA) injection 150 mg     Return for 11-13 wk DMPA.  No future appointments.  Alberteen Spindle, CNM

## 2022-07-15 LAB — IGP, APTIMA HPV
HPV Aptima: POSITIVE — AB
PAP Smear Comment: 0

## 2022-07-16 ENCOUNTER — Telehealth: Payer: Self-pay

## 2022-07-16 NOTE — Telephone Encounter (Signed)
LM for pt to return my call

## 2022-07-17 ENCOUNTER — Telehealth: Payer: Self-pay

## 2022-07-17 NOTE — Telephone Encounter (Signed)
LM for pt to return my call

## 2022-07-17 NOTE — Telephone Encounter (Signed)
Calling pt re pap result from 07/03/22 specimen and colpo referral. HSIL, positive HPV  Confirm insurance and where pt wants to go for colpo.

## 2022-07-20 NOTE — Telephone Encounter (Signed)
Phone call to pt at [phone]. Left message that RN with [facility] is calling re pap result and follow-up. Please call Edvin Albus at [phone].

## 2022-07-21 ENCOUNTER — Ambulatory Visit: Payer: BC Managed Care – PPO

## 2022-07-21 DIAGNOSIS — A749 Chlamydial infection, unspecified: Secondary | ICD-10-CM

## 2022-07-21 MED ORDER — DOXYCYCLINE HYCLATE 100 MG PO TABS: 100.0000 mg | ORAL_TABLET | Freq: Two times a day (BID) | ORAL | 0 refills | Status: AC

## 2022-07-21 NOTE — Telephone Encounter (Signed)
Phone call to pt at [phone]. Pt confirmed identity. Discussed pap result and colpo referral.  Confirms her [facility] is active. Desires to go to [facility] for colpo.  Counseled that referral will be sent to [facility] and expect a call directly from them for scheduling.

## 2022-07-21 NOTE — Progress Notes (Signed)
Pt is here for treatment of Chlamydia.  Pt is on Depo. LMP was 07/16/2022.  The patient was dispensed Doxycycline 100 mg #14, per SO today. I provided counseling today regarding the medication. We discussed the medication, the side effects and when to call clinic. Patient given the opportunity to ask questions. Questions answered.   Pt given condoms, a Doxycycline information sheet and a Chlamydia pamphlet.  Berdie Ogren, RN

## 2022-07-21 NOTE — Telephone Encounter (Signed)
07/21/22 - referral faxed to [facility] by Henriette Combs, RN.

## 2022-07-22 NOTE — Telephone Encounter (Signed)
Received phone call from Dyer at [facility] re colpo referral. States pt cannot be seen by Ihor Gully, insurance shows pt has "out of state out of [employer] plan and other issues. Would pay out of network benefits. Pt could call her insurnace to verify coverage. Pt has been contacted previously re issue.

## 2022-07-23 NOTE — Telephone Encounter (Signed)
Phone call to pt at [phone]. Left message on voicemail that RN with [facility] is calling re colpo provider. Appt cannot be scheduled with Ihor Gully at this time, insurance is out of network. We have other providers in [facility] that she may be interested in and we can verify they take her insurance before scheduling an appt. Please call Ariele Vidrio at [phone].

## 2022-07-24 NOTE — Telephone Encounter (Signed)
Phone call to pt at [phone]. Left message on voicemail that RN with [facility] is calling re colpo referral. Is there another facility you would like to go to for colpo, left a detailed message yesterday about [place]. Please call Reniah Cottingham at [phone].

## 2022-07-24 NOTE — Telephone Encounter (Signed)
Received call back from pt, was able to call during her work break. Pt approves referral to go to another [facility] provider, [place]. Will request they confirm they take her insurance before incurring any fees.  07/24/22 Colpo referral sent to [facility] through Epic Referrals tab.

## 2022-08-03 ENCOUNTER — Encounter: Payer: Self-pay | Admitting: Obstetrics and Gynecology

## 2022-08-04 NOTE — Telephone Encounter (Signed)
Received update from Paige Chandler: Our office has been unsuccessful in reaching this patient for scheduling. Letter has been mailed to patients address on file as an alternate attempt to reach for scheduling.   Phone call to pt at [phone]. Left message, requesting her to call [name], [phone], during her break as time allows at work, to schedule appt.

## 2022-08-12 NOTE — Telephone Encounter (Signed)
08/12/22 Certified letter mailed to pt re colpo provider unable to reach her re needed f/u. See scanned copy.

## 2022-12-02 ENCOUNTER — Ambulatory Visit: Payer: BC Managed Care – PPO

## 2023-06-07 ENCOUNTER — Ambulatory Visit

## 2023-09-30 ENCOUNTER — Ambulatory Visit (LOCAL_COMMUNITY_HEALTH_CENTER): Payer: Self-pay | Admitting: Family Medicine

## 2023-09-30 ENCOUNTER — Encounter: Payer: Self-pay | Admitting: Family Medicine

## 2023-09-30 VITALS — BP 129/86 | HR 62 | Ht 64.0 in | Wt 195.8 lb

## 2023-09-30 DIAGNOSIS — K5900 Constipation, unspecified: Secondary | ICD-10-CM

## 2023-09-30 DIAGNOSIS — R030 Elevated blood-pressure reading, without diagnosis of hypertension: Secondary | ICD-10-CM

## 2023-09-30 DIAGNOSIS — R87613 High grade squamous intraepithelial lesion on cytologic smear of cervix (HGSIL): Secondary | ICD-10-CM

## 2023-09-30 DIAGNOSIS — Z3009 Encounter for other general counseling and advice on contraception: Secondary | ICD-10-CM

## 2023-09-30 DIAGNOSIS — Z30013 Encounter for initial prescription of injectable contraceptive: Secondary | ICD-10-CM

## 2023-09-30 DIAGNOSIS — A6000 Herpesviral infection of urogenital system, unspecified: Secondary | ICD-10-CM

## 2023-09-30 DIAGNOSIS — Z0000 Encounter for general adult medical examination without abnormal findings: Secondary | ICD-10-CM

## 2023-09-30 MED ORDER — MEDROXYPROGESTERONE ACETATE 150 MG/ML IM SUSP
150.0000 mg | INTRAMUSCULAR | Status: AC
  Administered 2023-09-30 – 2024-01-10 (×2): 150 mg via INTRAMUSCULAR

## 2023-09-30 NOTE — Progress Notes (Signed)
 [employer] [facility] DEPARTMENT [facility] [address], [address] [place] [address] Main phone: [phone]  Family Planning Visit - Repeat Yearly Visit  Subjective:  Paige Chandler is a 38 y.o. [id number]  being seen today for an annual wellness visit and to discuss contraception options. The patient is currently using abstinence for pregnancy prevention. Patient does not want a pregnancy in the next year.   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  Eyes:  Negative for blurred vision.  Respiratory:  Negative for cough and shortness of breath.   Cardiovascular:  Negative for claudication.  Gastrointestinal:  Positive for constipation. Negative for nausea.  Genitourinary:  Negative for dysuria and frequency.  Skin:  Negative for rash.  Neurological:  Negative for headaches.  Endo/Heme/Allergies:  Does not bruise/bleed easily.    See flowsheet for further details and programmatic requirements Hyperlink available at the top of the signed note in blue.  Flow sheet content below:  Pregnancy Intention Screening Does the patient want to become pregnant in the next year?: No Does the patient's partner want to become pregnant in the next year?: No Would the patient like to discuss contraceptive options today?: Yes Results Follow up Password: blue Is it okay to contact you by mail?: Yes Contraception History Past methods of contraception used by  patient:: Hormonal Injection, Contraceptive Patch Adverse effects associated with Hormonal Injection: none Adverse effects associated with Contraceptive Patch: got pregnant Sexual History What age did you start your period?: 16 How often do you have your period?: monthly Date of last sex?:  (beginning of year) Has the patient had unprotected sex within the last 5 days?: No Do you have sex with men, women, both men and women?: Men only In the past 2 months how many partners have you had sex with?: 0 In the past 12 months, how many partners have you had sex with?: 1 Is it possible that any of your sex partners in the past 12 months had sex with someone else whild they were still in a sexual relationship with you?: No What ways do you have sex?: Vaginal Do you or your partner use condoms and/or dental dams every time you have vaginal, oral or anal sex?: Yes Do you douche?: No Date of last HIV test?: 05/28/20 Have you ever had an STD?: No Have any of your partners had an STD?: No Have you or your partner ever shot up drugs?: No Have any of your partners used drugs in the past?: No Have you or your partners exchanged money or drugs for sex?: No Counseling All Patients: Use specific methods of contraceoptive and identify adverse effects (R) Education: Make informed decision about family planning, Provided preconception counseling, Reduce risk of transmission and protection from STD's and HIV, Understand BMI >25 or >18.5 is a health risk (weight management educational materials to be provided to client requests), Promoted daily consumption of MVI with folic acid if capable of conceiving.,  Review immunization history, inform client of recommended vaccines per CDC's ACIP Guidelines and refer to Immunization clinic, Results of physical assessment and labs (if performed), How to discontinue the method selected and information on back up method used, How to use the method selected and information on back  up method used, How to use the method consistently and correctly, Teach back method completed, Warning signs for rare but serious adverse events and what to do if they experience a warning sign (including emergency 24 hour number, where to seek emergency service outside of hours of operation), When to return for follow up (planned return schedule), PCP list given to patient  Diabetes screening This patient is 38 y.o. with a BMI of Body mass index is 33.61 kg/m.Paige Chandler  Is patient eligible for diabetes screening (age >35 and BMI >25)? yes Was Hgb A1c ordered? No- declined  STI screening Patient reports 1 of partners in last year.  Does this patient desire STI screening?  No - declined  Hepatitis C screening Has patient been screened once for HCV in the past?  No  No results found for: HCVAB  Does the patient meet criteria for HCV testing? No  (If yes-- Screen for HCV through [facility]) Criteria:  Since the last HCV result, does the patient have any of the following? - Current drug use - Have a partner with drug use - Has been incarcerated  Hepatitis B screening Does the patient meet criteria for HBV testing? No Criteria:  -Household, sexual or needle sharing contact with HBV -History of drug use -HIV positive -Those with known Hep C  Cervical Cancer Screening  Result Date Procedure Results Follow-ups  07/03/2022 IGP, Aptima HPV DIAGNOSIS:: Comment (A) Specimen adequacy:: Comment Clinician Provided ICD10: Comment Performed by:: Comment Electronically signed by:: Comment PAP Smear Comment: . PATHOLOGIST PROVIDED ICD10:: Comment Note:: Comment Test Methodology: Comment HPV Aptima: Positive (A)   06/27/2021 IGP, Aptima HPV DIAGNOSIS:: Comment (A) Recommendation:: Comment (A) Specimen adequacy:: Comment Clinician Provided ICD10: Comment Performed by:: Comment Electronically signed by:: Comment PAP Smear Comment: . PATHOLOGIST PROVIDED ICD10:: Comment Note:: Comment Test  Methodology: Comment HPV Aptima: Positive (A)   05/28/2020 IGP, Aptima HPV DIAGNOSIS:: Comment (A) Specimen adequacy:: Comment Clinician Provided ICD10: Comment Performed by:: Comment Electronically signed by:: Comment PAP Smear Comment: . PATHOLOGIST PROVIDED ICD10:: Comment Note:: Comment Test Methodology: Comment HPV Aptima: Positive (A)   11/17/2017 HM PAP SMEAR HM Pap smear: ASC-H   07/03/2016 IGP, Aptima HPV      Health Maintenance Due  Topic Date Due   Hepatitis C Screening  Never done   Hepatitis B Vaccines (3 of 3 - 19+ 3-dose series) 12/27/2007   HPV VACCINES (1 - 3-dose SCDM series) Never done   DTaP/Tdap/Td (2 - Td or Tdap) 04/21/2017   COVID-19 Vaccine (2 - 2024-25 season) 11/29/2022   Cervical Cancer Screening (Pap smear)  07/03/2023    The following portions of the patient's history were reviewed and updated as appropriate: allergies, current medications, past family history, past medical history, past social history, past surgical history and problem list. Problem list updated.  Objective:   Vitals:   09/30/23 0944 09/30/23 0946  BP: (!) 134/90 129/86  Pulse: 62   Weight: 195 lb 12.8 oz (88.8 kg)   Height: 5' 4 (1.626 m)     Physical Exam Constitutional:      Appearance: Normal appearance.  HENT:     Head: Normocephalic and atraumatic.  Pulmonary:     Effort:  Pulmonary effort is normal.  Abdominal:     Palpations: Abdomen is soft.  Musculoskeletal:        General: Normal range of motion.  Skin:    General: Skin is warm and dry.  Neurological:     General: No focal deficit present.     Mental Status: She is alert.  Psychiatric:        Mood and Affect: Mood normal.        Behavior: Behavior normal.     Assessment and Plan:  Paige Chandler is a 38 y.o. female [phone] presenting to the [facility] Department for an yearly wellness and contraception visit   1. Family planning (Primary) Contraception counseling:  Reviewed options  based on patient desire and reproductive life plan. Patient is interested in Hormonal Injection. This was provided to the patient today.   Risks, benefits, and typical effectiveness rates were reviewed.  Questions were answered.  Written information was also given to the patient to review.    The patient will follow up in  3 months for surveillance.  The patient was told to call with any further questions, or with any concerns about this method of contraception.  Emphasized use of condoms 100% of the time for STI prevention.  Emergency Contraception Precautions (ECP): Patient assessed for need of ECP. She is not a candidate based on no intercourse since LMP.  Educated on ECP and reviewed options.  Patient desires no method - patient politely declines any emergency contraception.   - medroxyPROGESTERone  (DEPO-PROVERA ) injection 150 mg  2. Genital herpes simplex, unspecified site -no reports of outbreaks  3. HGSIL (high grade squamous intraepithelial lesion)  05/28/20 on Pap smear of cervix -long discussion with patient regarding value of continuing pap smears when recommendation has been colposcopy -reports she has thought about this- and is aware colpo would be the next step- but feels uncomfortable with the procedure and for now does not want to do a colposcopy -offered to repeat pap today- but patient politely declines- reviewed that if/when she feels ready to proceed, can come back and we can schedule colpo vs.repeat pap  4. Elevated blood pressure reading -reviewed genetic vs lifestyle components of HTN -educated on exercise, diet changes, and stress management -seems to have several first degree relatives with HTN -strongly recommended PCP for eval- as medications likely with a strong fam hx  5. Constipation, unspecified constipation type -reports occasional pain in right lower quad- first felt it on Sunday- sharp in nature, 8/10, intermittent -reports she was drinking a lot of water,  but has never been regular with Bms -can do 1-2 days without having BM- and now changed jobs and has decreased water intake significantly -appears to be constipation related abdominal pain- no red flag symptoms -counseled to continue to monitor- and RTC if pain appears to be related to cycle, change in menses, pain with sex etc -reviewed that ultrasound could be a step if patient feels it is more pelvic or ovarian related pain- declined STI testing today as no sex since Jan and this is a new pain  6. Well woman exam (no gynecological exam) -offered A1c today- declined -offered CBE today-declined  -offered STI Testing- declined  Return in about 3 months (around 12/31/2023) for depo injection.  No future appointments.  Verneta Bers, [place]

## 2023-09-30 NOTE — Progress Notes (Signed)
 Patient is here for family planning visit. Family planning education card given to patient. Depo given in RUOQ; tolerated well. Reminder card given for next depo.  Doyce CINDERELLA Shuck, RN

## 2023-12-27 ENCOUNTER — Ambulatory Visit

## 2024-01-10 ENCOUNTER — Ambulatory Visit

## 2024-01-10 VITALS — BP 130/72 | Ht 64.0 in | Wt 190.5 lb

## 2024-01-10 DIAGNOSIS — Z3009 Encounter for other general counseling and advice on contraception: Secondary | ICD-10-CM

## 2024-01-10 DIAGNOSIS — Z308 Encounter for other contraceptive management: Secondary | ICD-10-CM | POA: Diagnosis not present

## 2024-01-10 DIAGNOSIS — Z3042 Encounter for surveillance of injectable contraceptive: Secondary | ICD-10-CM

## 2024-01-10 DIAGNOSIS — Z30013 Encounter for initial prescription of injectable contraceptive: Secondary | ICD-10-CM | POA: Diagnosis not present

## 2024-01-10 NOTE — Progress Notes (Signed)
 14 Weeks   4 Days since last Depo   Voices no concerns today.  Counseled to adhere to 11 to 13 week intervals between depo injections for optimal benefit.  Depo given today per order by 09/30/2023  dated 09/30/2023.  Tolerated well LUOQ.  Next depo due 03/27/2024, has reminder card.  Getsemani Lindon, RN

## 2024-01-28 ENCOUNTER — Other Ambulatory Visit: Payer: Self-pay | Admitting: Unknown Physician Specialty

## 2024-02-03 ENCOUNTER — Other Ambulatory Visit: Payer: Self-pay | Admitting: Unknown Physician Specialty

## 2024-03-10 ENCOUNTER — Encounter: Payer: Self-pay | Admitting: Anesthesiology

## 2024-03-10 ENCOUNTER — Ambulatory Visit: Admission: RE | Admit: 2024-03-10 | Admitting: Unknown Physician Specialty

## 2024-03-10 ENCOUNTER — Encounter: Admission: RE | Payer: Self-pay | Source: Home / Self Care

## 2024-03-10 SURGERY — TONSILLECTOMY AND ADENOIDECTOMY
Anesthesia: General | Laterality: Bilateral
